# Patient Record
Sex: Female | Born: 1965 | Race: Black or African American | Hispanic: No | Smoking: Never smoker
Health system: Southern US, Community
[De-identification: ages and names within clinical notes are randomized; demographics above are authoritative.]

## PROBLEM LIST (undated history)

## (undated) DIAGNOSIS — J45909 Unspecified asthma, uncomplicated: Secondary | ICD-10-CM

## (undated) HISTORY — PX: FOOT SURGERY: SHX648

---

## 2002-05-10 ENCOUNTER — Emergency Department (HOSPITAL_COMMUNITY): Admission: EM | Admit: 2002-05-10 | Discharge: 2002-05-11 | Payer: Self-pay | Admitting: Emergency Medicine

## 2004-08-29 ENCOUNTER — Emergency Department: Payer: Self-pay | Admitting: Unknown Physician Specialty

## 2004-09-13 ENCOUNTER — Emergency Department: Payer: Self-pay

## 2004-09-30 ENCOUNTER — Emergency Department: Payer: Self-pay | Admitting: Emergency Medicine

## 2011-03-31 ENCOUNTER — Encounter: Payer: Self-pay | Admitting: Family Medicine

## 2011-03-31 ENCOUNTER — Emergency Department (INDEPENDENT_AMBULATORY_CARE_PROVIDER_SITE_OTHER): Payer: Self-pay

## 2011-03-31 ENCOUNTER — Emergency Department (HOSPITAL_BASED_OUTPATIENT_CLINIC_OR_DEPARTMENT_OTHER)
Admission: EM | Admit: 2011-03-31 | Discharge: 2011-03-31 | Disposition: A | Discharge status: Home / Self Care | Payer: Self-pay | Attending: Emergency Medicine | Admitting: Emergency Medicine

## 2011-03-31 DIAGNOSIS — M25519 Pain in unspecified shoulder: Secondary | ICD-10-CM | POA: Insufficient documentation

## 2011-03-31 MED ORDER — HYDROCODONE-ACETAMINOPHEN 5-500 MG PO TABS: 1.0000 | ORAL_TABLET | Freq: Four times a day (QID) | ORAL | Status: AC | PRN

## 2011-03-31 MED ORDER — CYCLOBENZAPRINE HCL 10 MG PO TABS: 5.0000 mg | ORAL_TABLET | Freq: Two times a day (BID) | ORAL | Status: AC | PRN

## 2011-03-31 NOTE — ED Notes (Signed)
Pt c/o right upper arm pain since March. Pt sts pain has increased. Pt does not recall injuring arm.

## 2011-03-31 NOTE — ED Provider Notes (Signed)
History     CSN: [id number] Arrival date & time: 03/31/2011 11:45 AM  Chief Complaint  Patient presents with  . Arm Pain    (Consider location/radiation/quality/duration/timing/severity/associated sxs/prior treatment) HPI Comments: Pt states that she has been having the pain for several months with the pain being the worst in the last couple of months:pt denies any injury  Patient is a 45 y.o. female presenting with arm pain. The history is provided by the patient. No language interpreter was used.  Arm Pain This is a new problem. The current episode started today. The problem occurs constantly. The problem has been unchanged. Pertinent negatives include no numbness or weakness. The symptoms are aggravated by nothing. She has tried NSAIDs for the symptoms. The treatment provided no relief.  Arm Pain This is a new problem. The current episode started today. The problem occurs constantly. The problem has been unchanged. The symptoms are aggravated by nothing. She has tried NSAIDs for the symptoms. The treatment provided no relief.    History reviewed. No pertinent past medical history.  Past Surgical History  Procedure Date  . Foot surgery   . Cesarean section     No family history on file.  History  Substance Use Topics  . Smoking status: Never Smoker   . Smokeless tobacco: Not on file  . Alcohol Use: Yes    OB History    Grav Para Term Preterm Abortions TAB SAB Ect Mult Living                  Review of Systems  Constitutional: Negative.   HENT: Negative.   Respiratory: Negative.   Cardiovascular: Negative.   Neurological: Negative for weakness and numbness.    Allergies  Macrobid  Home Medications  No current outpatient prescriptions on file.  BP 113/85  Pulse 89  Temp(Src) 98.2 F (36.8 C) (Oral)  Resp 16  Ht 5\' 2"  (1.575 m)  Wt 140 lb (63.504 kg)  BMI 25.61 kg/m2  SpO2 100%  LMP 03/30/2011  Physical Exam  Nursing note and vitals  reviewed. Constitutional: She is oriented to person, place, and time. She appears well-developed and well-nourished.  HENT:  Head: Normocephalic and atraumatic.  Neck: Normal range of motion. Neck supple.  Cardiovascular: Normal rate and regular rhythm.   Pulmonary/Chest: Effort normal and breath sounds normal.  Musculoskeletal: Normal range of motion. She exhibits no edema.       Pt tender the in anterior aspect of the right shoulder:pt has full rom:pt has no cervical spine tenderness  Neurological: She is alert and oriented to person, place, and time.  Skin: Skin is warm and dry.  Psychiatric: She has a normal mood and affect.    ED Course  Procedures (including critical care time)  Labs Reviewed - No data to display Dg Shoulder Right  03/31/2011  *RADIOLOGY REPORT*  Clinical Data: Right shoulder pain.  RIGHT SHOULDER - 2+ VIEW  Comparison: None  Findings: No acute bony abnormality.  Specifically, no fracture, subluxation, or dislocation.  Soft tissues are intact.  IMPRESSION: No acute bony abnormality.  Original Report Authenticated By: Cyndie Chime, M.D.     1. Shoulder pain       MDM  No acute finding noted on x-ray:pt not having any neuro deficits:discussed follow up with pt    Medical screening examination/treatment/procedure(s) were performed by non-physician practitioner and as supervising physician I was immediately available for consultation/collaboration.    Teressa Lower, NP 03/31/11 1313  Trudi Ida  Denton Lank, MD 04/10/11 1620

## 2011-04-09 ENCOUNTER — Ambulatory Visit (INDEPENDENT_AMBULATORY_CARE_PROVIDER_SITE_OTHER): Payer: Self-pay | Admitting: Family Medicine

## 2011-04-09 ENCOUNTER — Encounter: Payer: Self-pay | Admitting: Family Medicine

## 2011-04-09 VITALS — BP 108/75 | HR 85 | Temp 98.0°F | Ht 62.0 in | Wt 140.0 lb

## 2011-04-09 DIAGNOSIS — M25519 Pain in unspecified shoulder: Secondary | ICD-10-CM

## 2011-04-09 DIAGNOSIS — M25511 Pain in right shoulder: Secondary | ICD-10-CM | POA: Insufficient documentation

## 2011-04-09 NOTE — Patient Instructions (Signed)
You have rotator cuff impingement (incorporates tendinitis, subacromial bursitis - all treated the same). Try to avoid painful activities (overhead activities, lifting with extended arm) as much as possible. Take aleve 2 tabs twice a day with food for pain and inflammation x 7 days then as needed. Subacromial injection may be beneficial to help with pain and to decrease inflammation. Start physical therapy for 1-3 visits then transition to a home program. Follow up with me in 1 month. If not improving as expected will consider further imaging (ultrasound, MRI).

## 2011-04-09 NOTE — Assessment & Plan Note (Signed)
Right shoulder rotator cuff impingement - offered injection vs nsaids - patient would like to go ahead with injection today.  Start PT for 1-3 visits to learn HEP then do daily for next 6 weeks regularly.  Icing, relative rest.  Minimize lifting, overhead motions as much as possible.  See instructions for further.  Consider ultrasound if not improving as expected.  After informed written consent, patient was seated on exam table. Right shoulder was prepped with alcohol swab and utilizing posterior approach, patient's right subacromial space was injected with 3:1 marcaine: depomedrol. Patient tolerated the procedure well without immediate complications.

## 2011-04-09 NOTE — Progress Notes (Signed)
Subjective:    Patient ID: Phyllis Collins, female    DOB: 1965-12-30, 45 y.o.   MRN: [id number]  PCP: Sharen Hint  HPI 45 yo F here with right shoulder/arm pain.  Patient reports 7-8 months of slowly worsening right shoulder/arm pain. No known injury to account for this. Nothing in particular seems to make this worse. Is right handed. No numbness or tingling. No neck pain. No bowel/bladder dysfunction. No left arm pain. Has tried aleve, heat, biofreeze, flexeril/vicodin (from ED). X-rays of shoulder were negative. + night pain.  History reviewed. No pertinent past medical history.  Current Outpatient Prescriptions on File Prior to Visit  Medication Sig Dispense Refill  . cyclobenzaprine (FLEXERIL) 10 MG tablet Take 0.5 tablets (5 mg total) by mouth 2 (two) times daily as needed for muscle spasms.  10 tablet  0  . HYDROcodone-acetaminophen (VICODIN) 5-500 MG per tablet Take 1-2 tablets by mouth every 6 (six) hours as needed for pain.  15 tablet  0    Past Surgical History  Procedure Date  . Foot surgery   . Cesarean section     Allergies  Allergen Reactions  . Macrobid     History   Social History  . Marital Status: Divorced    Spouse Name: N/A    Number of Children: N/A  . Years of Education: N/A   Occupational History  . Not on file.   Social History Main Topics  . Smoking status: Never Smoker   . Smokeless tobacco: Not on file  . Alcohol Use: Yes  . Drug Use: No  . Sexually Active: Yes    Birth Control/ Protection: Condom   Other Topics Concern  . Not on file   Social History Narrative  . No narrative on file    Family History  Problem Relation Age of Onset  . Sudden death Father   . Heart attack Father   . Hypertension Paternal Grandmother   . Diabetes Neg Hx   . Hyperlipidemia Neg Hx     BP 108/75  Pulse 85  Temp(Src) 98 F (36.7 C) (Oral)  Ht 5\' 2"  (1.575 m)  Wt 140 lb (63.504 kg)  BMI 25.61 kg/m2  LMP 03/30/2011  Review of  Systems See HPI above.    Objective:   Physical Exam Gen: NAD  Neck: No gross deformity, swelling, bruising. No paraspinal TTP .  No midline/bony TTP. FROM neck without pain . BUE strength 5/5 except empty can.   Sensation intact to light touch.   2+ equal reflexes in triceps, biceps, brachioradialis tendons. Negative spurlings. NV intact distal BUEs.  R shoulder: No swelling, ecchymoses.  No gross deformity. No TTP. FROM with + painful arc. Positive Hawkins, Neers. Negative Speeds, Yergasons. Positive Empty can with 4/5 strength on right.  5/5 strength resisted internal/external rotation without pain. Negative apprehension. NV intact distally.    Assessment & Plan:  1. Right shoulder rotator cuff impingement - offered injection vs nsaids - patient would like to go ahead with injection today.  Start PT for 1-3 visits to learn HEP then do daily for next 6 weeks regularly.  Icing, relative rest.  Minimize lifting, overhead motions as much as possible.  See instructions for further.  Consider ultrasound if not improving as expected.  After informed written consent, patient was seated on exam table. Right shoulder was prepped with alcohol swab and utilizing posterior approach, patient's right subacromial space was injected with 3:1 marcaine: depomedrol. Patient tolerated the procedure well without immediate  complications.

## 2011-04-12 ENCOUNTER — Ambulatory Visit: Payer: Self-pay | Attending: Family Medicine | Admitting: Physical Therapy

## 2011-05-10 ENCOUNTER — Ambulatory Visit: Payer: Self-pay | Admitting: Family Medicine

## 2011-06-16 ENCOUNTER — Ambulatory Visit (INDEPENDENT_AMBULATORY_CARE_PROVIDER_SITE_OTHER): Payer: Self-pay | Admitting: Family Medicine

## 2011-06-16 ENCOUNTER — Encounter: Payer: Self-pay | Admitting: Family Medicine

## 2011-06-16 VITALS — BP 114/78 | HR 91 | Temp 97.5°F | Ht 62.0 in | Wt 136.0 lb

## 2011-06-16 DIAGNOSIS — M25511 Pain in right shoulder: Secondary | ICD-10-CM

## 2011-06-16 DIAGNOSIS — M25519 Pain in unspecified shoulder: Secondary | ICD-10-CM

## 2011-06-16 MED ORDER — MELOXICAM 15 MG PO TABS: 15.0000 mg | ORAL_TABLET | Freq: Every day | ORAL | Status: AC

## 2011-06-16 MED ORDER — TRAMADOL HCL 50 MG PO TABS: 50.0000 mg | ORAL_TABLET | Freq: Four times a day (QID) | ORAL | Status: AC | PRN

## 2011-06-16 NOTE — Assessment & Plan Note (Signed)
Right shoulder rotator cuff impingement - u/s done today reaffirms diagnosis.  Stressed need for patient to go to physical therapy for strengthening.  Would not repeat injection at this time though can consider in future.  Start meloxicam daily with food, tramadol as needed for pain (vicodin only caused itching - will try this).  Icing, relative rest as we discussed.

## 2011-06-16 NOTE — Patient Instructions (Signed)
You have rotator cuff impingement (incorporates tendinitis, subacromial bursitis - all treated the same). Your ultrasound looked great though it confirmed impingement. Try to avoid painful activities (overhead activities, lifting with extended arm) as much as possible. Take aleve 2 tabs twice a day with food for pain and inflammation as needed. Subacromial injection may be beneficial to help with pain and to decrease inflammation - can repeat this once more but would not do it more than that. Start physical therapy for 1-3 visits then transition to a home program - this is very important. Follow up with me in 1 month to 6 weeks for a recheck.

## 2011-06-16 NOTE — Progress Notes (Addendum)
Subjective:    Patient ID: Phyllis Collins, female    DOB: [birth year], 45 y.o.   MRN: [id number]  PCP: Sharen Hint  HPI  45 yo F here with f/u right shoulder/arm pain.  10/19: Patient reports 7-8 months of slowly worsening right shoulder/arm pain. No known injury to account for this. Nothing in particular seems to make this worse. Is right handed. No numbness or tingling. No neck pain. No bowel/bladder dysfunction. No left arm pain. Has tried aleve, heat, biofreeze, flexeril/vicodin (from ED). X-rays of shoulder were negative. + night pain.  12/26: Patient returns of right shoulder pain. States subacromial injection helped for a month but pain recurred. Unable to do PT due to her work schedule - has not gone yet. Vicodin caused too much itching so she stopped this. + night pain.  History reviewed. No pertinent past medical history.  No current outpatient prescriptions on file prior to visit.    Past Surgical History  Procedure Date  . Foot surgery   . Cesarean section     Allergies  Allergen Reactions  . Macrobid   . Vicodin (Hydrocodone-Acetaminophen)     itching    History   Social History  . Marital Status: Divorced    Spouse Name: N/A    Number of Children: N/A  . Years of Education: N/A   Occupational History  . Not on file.   Social History Main Topics  . Smoking status: Never Smoker   . Smokeless tobacco: Not on file  . Alcohol Use: Yes  . Drug Use: No  . Sexually Active: Yes    Birth Control/ Protection: Condom   Other Topics Concern  . Not on file   Social History Narrative  . No narrative on file    Family History  Problem Relation Age of Onset  . Sudden death Father   . Heart attack Father   . Hypertension Paternal Grandmother   . Diabetes Neg Hx   . Hyperlipidemia Neg Hx     BP 114/78  Pulse 91  Temp(Src) 97.5 F (36.4 C) (Oral)  Ht 5\' 2"  (1.575 m)  Wt 136 lb (61.689 kg)  BMI 24.87 kg/m2  Review of Systems  See  HPI above.    Objective:   Physical Exam  Gen: NAD  R shoulder: No swelling, ecchymoses.  No gross deformity. No TTP. FROM with + painful arc. Positive Hawkins, Neers. Negative Speeds, Yergasons. Positive Empty can with 4+/5 strength on right.  5/5 strength resisted internal/external rotation without pain. Negative apprehension. NV intact distally.  MSK u/s R shoulder: Biceps tendon intact on trans and long views.  AC joint appears normal.  Subscapularis intact on trans and long views without tears or impingement.  Infraspinatus appears intact on long view.  Supraspinatus with evidence impingement on dynamic motion under acromion.  No evidence of supraspinatus tear, mildly increased fluid in subacromial bursa.    Assessment & Plan:  1. Right shoulder rotator cuff impingement - u/s done today reaffirms diagnosis.  Stressed need for patient to go to physical therapy for strengthening.  Would not repeat injection at this time though can consider in future.  Start meloxicam daily with food, tramadol as needed for pain (vicodin only caused itching - will try this).  Icing, relative rest as we discussed.    Addendum 1/9: Patient called stating right shoulder pain has worsened more.  She is unable to do physical therapy as she does not have insurance.  Has been given contact information  to get setup with [facility] Coverage so we can start PT.  Does a lot of manual labor at work - will write for her to be out this week and see her tomorrow or Friday to reexamine, do cortisone injection, and write for light duty.  Would prefer to do PT first and is a little early to repeat injection but options limited without insurance - would not do a 3rd injection.

## 2011-06-30 ENCOUNTER — Encounter: Payer: Self-pay | Admitting: Family Medicine

## 2011-07-01 ENCOUNTER — Encounter: Payer: Self-pay | Admitting: Family Medicine

## 2011-07-01 ENCOUNTER — Ambulatory Visit (INDEPENDENT_AMBULATORY_CARE_PROVIDER_SITE_OTHER): Payer: Self-pay | Admitting: Family Medicine

## 2011-07-01 VITALS — BP 120/76 | HR 87 | Temp 98.0°F | Ht 62.0 in | Wt 135.0 lb

## 2011-07-01 DIAGNOSIS — M25519 Pain in unspecified shoulder: Secondary | ICD-10-CM

## 2011-07-01 DIAGNOSIS — M25511 Pain in right shoulder: Secondary | ICD-10-CM

## 2011-07-02 ENCOUNTER — Encounter: Payer: Self-pay | Admitting: Family Medicine

## 2011-07-02 NOTE — Assessment & Plan Note (Signed)
confirmed by ultrasound last visit.  Applying for [facility] coverage.  Given repeat subacromial injection today (would not do this a 3rd time) and shown home exercises.  Otherwise continue icing, tylenol/nsaids as needed.  Tramadol as needed for pain.  Start light duty - no lifting more than 5 pounds and no overhead activities with right arm to allow this to rest.  Will reassess at f/u in 1 month, call when [facility] coverage goes through to start formal PT.  After informed written consent, patient was seated on exam table. Right shoulder was prepped with alcohol swab and utilizing posterior approach, patient's right subacromial space was injected with 3:1 marcaine: depomedrol. Patient tolerated the procedure well without immediate complications.

## 2011-07-02 NOTE — Progress Notes (Signed)
Subjective:    Patient ID: Phyllis Collins, female    DOB: [birth year], 46 y.o.   MRN: [id number]  PCP: Sharen Hint  Shoulder Pain    46 yo F here with f/u right shoulder/arm pain.  10/19: Patient reports 7-8 months of slowly worsening right shoulder/arm pain. No known injury to account for this. Nothing in particular seems to make this worse. Is right handed. No numbness or tingling. No neck pain. No bowel/bladder dysfunction. No left arm pain. Has tried aleve, heat, biofreeze, flexeril/vicodin (from ED). X-rays of shoulder were negative. + night pain.  12/26: Patient returns of right shoulder pain. States subacromial injection helped for a month but pain recurred. Unable to do PT due to her work schedule - has not gone yet. Vicodin caused too much itching so she stopped this. + night pain.  07/01/11: Patient returned today for repeat subacromial injection. Goal was to start PT but patient does not have any insurance or [employer] coverage - going to start this process today though and call to start PT. + night pain. Otherwise unchanged from last visit.  History reviewed. No pertinent past medical history.  Current Outpatient Prescriptions on File Prior to Visit  Medication Sig Dispense Refill  . meloxicam (MOBIC) 15 MG tablet Take 1 tablet (15 mg total) by mouth daily. With food.  30 tablet  1    Past Surgical History  Procedure Date  . Foot surgery   . Cesarean section     Allergies  Allergen Reactions  . Macrobid   . Vicodin (Hydrocodone-Acetaminophen)     itching    History   Social History  . Marital Status: Divorced    Spouse Name: N/A    Number of Children: N/A  . Years of Education: N/A   Occupational History  . Not on file.   Social History Main Topics  . Smoking status: Never Smoker   . Smokeless tobacco: Not on file  . Alcohol Use: Yes  . Drug Use: No  . Sexually Active: Yes    Birth Control/ Protection: Condom   Other Topics Concern  .  Not on file   Social History Narrative  . No narrative on file    Family History  Problem Relation Age of Onset  . Sudden death Father   . Heart attack Father   . Hypertension Paternal Grandmother   . Diabetes Neg Hx   . Hyperlipidemia Neg Hx     BP 120/76  Pulse 87  Temp(Src) 98 F (36.7 C) (Oral)  Ht 5\' 2"  (1.575 m)  Wt 135 lb (61.236 kg)  BMI 24.69 kg/m2  Review of Systems  See HPI above.    Objective:   Physical Exam  Gen: NAD *Exam not repeated today  R shoulder: No swelling, ecchymoses.  No gross deformity. No TTP. FROM with + painful arc. Positive Hawkins, Neers. Negative Speeds, Yergasons. Positive Empty can with 4+/5 strength on right.  5/5 strength resisted internal/external rotation without pain. Negative apprehension. NV intact distally.     Assessment & Plan:  1. Right shoulder rotator cuff impingement - confirmed by ultrasound last visit.  Applying for [employer] coverage.  Given repeat subacromial injection today (would not do this a 3rd time) and shown home exercises.  Otherwise continue icing, tylenol/nsaids as needed.  Tramadol as needed for pain.  Start light duty - no lifting more than 5 pounds and no overhead activities with right arm to allow this to rest.  Will reassess at f/u in  1 month, call when [employer] coverage goes through to start formal PT.  After informed written consent, patient was seated on exam table. Right shoulder was prepped with alcohol swab and utilizing posterior approach, patient's right subacromial space was injected with 3:1 marcaine: depomedrol. Patient tolerated the procedure well without immediate complications.

## 2011-07-29 ENCOUNTER — Ambulatory Visit: Payer: Self-pay | Admitting: Family Medicine

## 2011-11-04 ENCOUNTER — Other Ambulatory Visit: Payer: Self-pay | Admitting: *Deleted

## 2011-11-04 ENCOUNTER — Other Ambulatory Visit: Payer: Self-pay | Admitting: Family Medicine

## 2011-11-04 DIAGNOSIS — Z1231 Encounter for screening mammogram for malignant neoplasm of breast: Secondary | ICD-10-CM

## 2011-11-04 DIAGNOSIS — D219 Benign neoplasm of connective and other soft tissue, unspecified: Secondary | ICD-10-CM

## 2011-11-11 ENCOUNTER — Other Ambulatory Visit: Payer: Self-pay

## 2011-11-18 ENCOUNTER — Ambulatory Visit
Admission: RE | Admit: 2011-11-18 | Discharge: 2011-11-18 | Disposition: A | Discharge status: Home / Self Care | Payer: BC Managed Care – PPO | Source: Ambulatory Visit | Attending: Family Medicine | Admitting: Family Medicine

## 2011-11-18 DIAGNOSIS — D219 Benign neoplasm of connective and other soft tissue, unspecified: Secondary | ICD-10-CM

## 2011-12-02 ENCOUNTER — Ambulatory Visit: Payer: Self-pay

## 2012-03-18 ENCOUNTER — Emergency Department (HOSPITAL_BASED_OUTPATIENT_CLINIC_OR_DEPARTMENT_OTHER)
Admission: EM | Admit: 2012-03-18 | Discharge: 2012-03-18 | Disposition: A | Discharge status: Home / Self Care | Payer: Self-pay | Attending: Emergency Medicine | Admitting: Emergency Medicine

## 2012-03-18 ENCOUNTER — Encounter (HOSPITAL_BASED_OUTPATIENT_CLINIC_OR_DEPARTMENT_OTHER): Payer: Self-pay | Admitting: *Deleted

## 2012-03-18 DIAGNOSIS — N390 Urinary tract infection, site not specified: Secondary | ICD-10-CM | POA: Insufficient documentation

## 2012-03-18 DIAGNOSIS — J329 Chronic sinusitis, unspecified: Secondary | ICD-10-CM | POA: Insufficient documentation

## 2012-03-18 LAB — URINALYSIS, ROUTINE W REFLEX MICROSCOPIC
Bilirubin Urine: NEGATIVE
Nitrite: POSITIVE — AB
Specific Gravity, Urine: 1.019 (ref 1.005–1.030)
pH: 6 (ref 5.0–8.0)

## 2012-03-18 LAB — PREGNANCY, URINE: Preg Test, Ur: NEGATIVE

## 2012-03-18 LAB — URINE MICROSCOPIC-ADD ON

## 2012-03-18 MED ORDER — IBUPROFEN 600 MG PO TABS: 600.0000 mg | ORAL_TABLET | Freq: Four times a day (QID) | ORAL | Status: DC | PRN

## 2012-03-18 MED ORDER — OXYMETAZOLINE HCL 0.05 % NA SOLN: 2.0000 | Freq: Two times a day (BID) | NASAL | Status: DC

## 2012-03-18 MED ORDER — CEPHALEXIN 500 MG PO CAPS: 500.0000 mg | ORAL_CAPSULE | Freq: Four times a day (QID) | ORAL | Status: DC

## 2012-03-18 NOTE — ED Notes (Addendum)
Pt c/o sinus issues with drainage into throat onset Thursday. Also c/o LLQ pain. "Think I have a UTI"

## 2012-03-18 NOTE — ED Provider Notes (Signed)
History     CSN: [id number]  Arrival date & time 03/18/12  1617   First MD Initiated Contact with Patient 03/18/12 1709      Chief Complaint  Patient presents with  . Facial Pain    (Consider location/radiation/quality/duration/timing/severity/associated sxs/prior treatment) HPI Comments: Patient presents with complaint of nasal congestion, sinus pain, and intermittent headache for 2 days. No treatments prior to arrival. Patient denies seasonal allergies. She denies fever, sore throat, cough, chest pain. Patient also complains of left lower quadrant abdominal pain that she attributes to a possible urinary tract infection. She has had the same symptoms in the past with UTI. She denies dysuria, hematuria, vaginal discharge. Onset gradual. Course is constant. Nothing makes symptoms better or worse.  The history is provided by the patient.    History reviewed. No pertinent past medical history.  Past Surgical History  Procedure Date  . Foot surgery   . Cesarean section     Family History  Problem Relation Age of Onset  . Sudden death Father   . Heart attack Father   . Hypertension Paternal Grandmother   . Diabetes Neg Hx   . Hyperlipidemia Neg Hx     History  Substance Use Topics  . Smoking status: Never Smoker   . Smokeless tobacco: Not on file  . Alcohol Use: Yes    OB History    Grav Para Term Preterm Abortions TAB SAB Ect Mult Living                  Review of Systems  Constitutional: Negative for fever.  HENT: Positive for congestion, rhinorrhea and sinus pressure. Negative for ear pain, sore throat and tinnitus.   Eyes: Negative for redness.  Respiratory: Negative for cough.   Cardiovascular: Negative for chest pain.  Gastrointestinal: Positive for abdominal pain. Negative for nausea, vomiting and diarrhea.  Genitourinary: Negative for dysuria, hematuria, flank pain, vaginal bleeding and vaginal discharge.  Musculoskeletal: Negative for myalgias.  Skin:  Negative for rash.  Neurological: Positive for headaches.    Allergies  Nitrofurantoin monohyd macro and Vicodin  Home Medications   Current Outpatient Rx  Name Route Sig Dispense Refill  . MELOXICAM 15 MG PO TABS Oral Take 1 tablet (15 mg total) by mouth daily. With food. 30 tablet 1    BP 104/70  Pulse 94  Temp 98.2 F (36.8 C) (Oral)  Resp 14  Ht 5\' 2"  (1.575 m)  Wt 140 lb (63.504 kg)  BMI 25.61 kg/m2  SpO2 100%  Physical Exam  Nursing note and vitals reviewed. Constitutional: She appears well-developed and well-nourished.  HENT:  Head: Normocephalic and atraumatic.  Right Ear: Tympanic membrane, external ear and ear canal normal.  Left Ear: Tympanic membrane, external ear and ear canal normal.  Nose: Mucosal edema and rhinorrhea present. Right sinus exhibits frontal sinus tenderness. Right sinus exhibits no maxillary sinus tenderness. Left sinus exhibits frontal sinus tenderness. Left sinus exhibits no maxillary sinus tenderness.  Mouth/Throat: Uvula is midline and oropharynx is clear and moist. Mucous membranes are not dry. No oropharyngeal exudate, posterior oropharyngeal edema, posterior oropharyngeal erythema or tonsillar abscesses.  Eyes: Conjunctivae normal are normal. Right eye exhibits no discharge. Left eye exhibits no discharge.  Neck: Normal range of motion. Neck supple.  Cardiovascular: Normal rate, regular rhythm and normal heart sounds.   Pulmonary/Chest: Effort normal and breath sounds normal.  Abdominal: Soft. There is no tenderness.  Neurological: She is alert.  Skin: Skin is warm and dry.  Psychiatric: She has a normal mood and affect.    ED Course  Procedures (including critical care time)  Labs Reviewed  URINALYSIS, ROUTINE W REFLEX MICROSCOPIC - Abnormal; Notable for the following:    APPearance CLOUDY (*)     Hgb urine dipstick SMALL (*)     Nitrite POSITIVE (*)     Leukocytes, UA SMALL (*)     All other components within normal limits    URINE MICROSCOPIC-ADD ON - Abnormal; Notable for the following:    Squamous Epithelial / LPF FEW (*)     Bacteria, UA MANY (*)     All other components within normal limits  PREGNANCY, URINE   No results found.   1. Sinusitis   2. UTI (lower urinary tract infection)     5:43 PM Patient seen and examined.   Vital signs reviewed and are as follows: Filed Vitals:   03/18/12 1626  BP: 104/70  Pulse: 94  Temp: 98.2 F (36.8 C)  Resp: 14   Patient counseled on supportive treatment of sinusitis.  Patient informed of urinalysis results. Will treat with Keflex.  Patient urged to return with worsening symptoms, persistent fever, persistent vomiting, or other concerns. Patient verbalizes understanding and agrees with plan.   MDM  Abdominal pain: UA shows nitrate positive urinary tract infection, do not suspect pyelo. Will treat with 10 days of Keflex given recurrent UTI.  Sinusitis: Two-day history, do not suspect bacterial sinusitis. Conservative management indicated.        [place], [place] 03/18/12 [phone]

## 2012-03-18 NOTE — ED Provider Notes (Signed)
Medical screening examination/treatment/procedure(s) were performed by non-physician practitioner and as supervising physician I was immediately available for consultation/collaboration.   Gwyneth Sprout, MD 03/18/12 [phone]

## 2013-04-18 ENCOUNTER — Other Ambulatory Visit: Payer: Self-pay

## 2013-04-18 DIAGNOSIS — Z1231 Encounter for screening mammogram for malignant neoplasm of breast: Secondary | ICD-10-CM

## 2013-05-10 ENCOUNTER — Ambulatory Visit: Payer: BC Managed Care – PPO

## 2013-08-24 ENCOUNTER — Emergency Department (HOSPITAL_BASED_OUTPATIENT_CLINIC_OR_DEPARTMENT_OTHER)
Admission: EM | Admit: 2013-08-24 | Discharge: 2013-08-24 | Disposition: A | Discharge status: Home / Self Care | Payer: BC Managed Care – PPO | Attending: Emergency Medicine | Admitting: Emergency Medicine

## 2013-08-24 ENCOUNTER — Encounter (HOSPITAL_BASED_OUTPATIENT_CLINIC_OR_DEPARTMENT_OTHER): Payer: Self-pay | Admitting: Emergency Medicine

## 2013-08-24 DIAGNOSIS — Z792 Long term (current) use of antibiotics: Secondary | ICD-10-CM | POA: Insufficient documentation

## 2013-08-24 DIAGNOSIS — L258 Unspecified contact dermatitis due to other agents: Secondary | ICD-10-CM | POA: Insufficient documentation

## 2013-08-24 DIAGNOSIS — L259 Unspecified contact dermatitis, unspecified cause: Secondary | ICD-10-CM

## 2013-08-24 MED ORDER — HYDROCORTISONE 2.5 % EX LOTN: TOPICAL_LOTION | Freq: Two times a day (BID) | CUTANEOUS | Status: DC

## 2013-08-24 MED ORDER — HYDROXYZINE HCL 25 MG PO TABS: 25.0000 mg | ORAL_TABLET | Freq: Four times a day (QID) | ORAL | Status: DC | PRN

## 2013-08-24 MED ORDER — HYDROXYZINE HCL 25 MG PO TABS
25.0000 mg | ORAL_TABLET | Freq: Once | ORAL | Status: AC
  Administered 2013-08-24: 25 mg via ORAL
  Filled 2013-08-24: qty 1

## 2013-08-24 NOTE — Discharge Instructions (Signed)
Contact Dermatitis Contact dermatitis is a reaction to certain substances that touch the skin. Contact dermatitis can be either irritant contact dermatitis or allergic contact dermatitis. Irritant contact dermatitis does not require previous exposure to the substance for a reaction to occur.Allergic contact dermatitis only occurs if you have been exposed to the substance before. Upon a repeat exposure, your body reacts to the substance.  CAUSES  Many substances can cause contact dermatitis. Irritant dermatitis is most commonly caused by repeated exposure to mildly irritating substances, such as:  Makeup.  Soaps.  Detergents.  Bleaches.  Acids.  Metal salts, such as nickel. Allergic contact dermatitis is most commonly caused by exposure to:  Poisonous plants.  Chemicals (deodorants, shampoos).  Jewelry.  Latex.  Neomycin in triple antibiotic cream.  Preservatives in products, including clothing. SYMPTOMS  The area of skin that is exposed may develop:  Dryness or flaking.  Redness.  Cracks.  Itching.  Pain or a burning sensation.  Blisters. With allergic contact dermatitis, there may also be swelling in areas such as the eyelids, mouth, or genitals.  DIAGNOSIS  Your caregiver can usually tell what the problem is by doing a physical exam. In cases where the cause is uncertain and an allergic contact dermatitis is suspected, a patch skin test may be performed to help determine the cause of your dermatitis. TREATMENT Treatment includes protecting the skin from further contact with the irritating substance by avoiding that substance if possible. Barrier creams, powders, and gloves may be helpful. Your caregiver may also recommend:  Steroid creams or ointments applied 2 times daily. For best results, soak the rash area in cool water for 20 minutes. Then apply the medicine. Cover the area with a plastic wrap. You can store the steroid cream in the refrigerator for a "chilly"  effect on your rash. That may decrease itching. Oral steroid medicines may be needed in more severe cases.  Antibiotics or antibacterial ointments if a skin infection is present.  Antihistamine lotion or an antihistamine taken by mouth to ease itching.  Lubricants to keep moisture in your skin.  Burow's solution to reduce redness and soreness or to dry a weeping rash. Mix one packet or tablet of solution in 2 cups cool water. Dip a clean washcloth in the mixture, wring it out a bit, and put it on the affected area. Leave the cloth in place for 30 minutes. Do this as often as possible throughout the day.  Taking several cornstarch or baking soda baths daily if the area is too large to cover with a washcloth. Harsh chemicals, such as alkalis or acids, can cause skin damage that is like a burn. You should flush your skin for 15 to 20 minutes with cold water after such an exposure. You should also seek immediate medical care after exposure. Bandages (dressings), antibiotics, and pain medicine may be needed for severely irritated skin.  HOME CARE INSTRUCTIONS  Avoid the substance that caused your reaction.  Keep the area of skin that is affected away from hot water, soap, sunlight, chemicals, acidic substances, or anything else that would irritate your skin.  Do not scratch the rash. Scratching may cause the rash to become infected.  You may take cool baths to help stop the itching.  Only take over-the-counter or prescription medicines as directed by your caregiver.  See your caregiver for follow-up care as directed to make sure your skin is healing properly. SEEK MEDICAL CARE IF:   Your condition is not better after 3  days of treatment.  You seem to be getting worse.  You see signs of infection such as swelling, tenderness, redness, soreness, or warmth in the affected area.  You have any problems related to your medicines. Document Released: 06/04/2000 Document Revised: 08/30/2011  Document Reviewed: 11/10/2010 [facility] Patient Information 2014 [place], [place].   Take prescriptions as indicated Return if worsening or no gradual improvement

## 2013-08-24 NOTE — ED Provider Notes (Signed)
CSN: [id number]     Arrival date & time 08/24/13  1528 History   First MD Initiated Contact with Patient 08/24/13 1556     Chief Complaint  Patient presents with  . Rash     (Consider location/radiation/quality/duration/timing/severity/associated sxs/prior Treatment) Patient is a 48 y.o. female presenting with rash. The history is provided by the patient. No language interpreter was used.  Rash Location:  Torso Quality: itchiness and redness   Severity:  Moderate Duration:  3 days Context comment:  Rash developed after wearing a new, red shirt that had not been laundered Associated symptoms: no abdominal pain, no fatigue, no fever, no headaches, no joint pain, no myalgias, no nausea, no shortness of breath, no sore throat and no URI    Pt presents with a rash on her chest, back and upper arms. She reports itching after wearing a new red shirt that she had not laundered yet. She believes that this is what is causing her discomfort and benadryl has not helped much.   History reviewed. No pertinent past medical history. Past Surgical History  Procedure Laterality Date  . Foot surgery    . Cesarean section     Family History  Problem Relation Age of Onset  . Sudden death Father   . Heart attack Father   . Hypertension Paternal Grandmother   . Diabetes Neg Hx   . Hyperlipidemia Neg Hx    History  Substance Use Topics  . Smoking status: Never Smoker   . Smokeless tobacco: Not on file  . Alcohol Use: Yes   OB History   Grav Para Term Preterm Abortions TAB SAB Ect Mult Living                 Review of Systems  Constitutional: Negative for fever and fatigue.  HENT: Negative for sore throat.   Respiratory: Negative for shortness of breath.   Gastrointestinal: Negative for nausea and abdominal pain.  Musculoskeletal: Negative for arthralgias and myalgias.  Skin: Positive for rash.  Neurological: Negative for headaches.  All other systems reviewed and are  negative.      Allergies  Nitrofurantoin monohyd macro and Vicodin  Home Medications   Current Outpatient Rx  Name  Route  Sig  Dispense  Refill  . cephALEXin (KEFLEX) 500 MG capsule   Oral   Take 1 capsule (500 mg total) by mouth 4 (four) times daily.   40 capsule   0   . ibuprofen (ADVIL,MOTRIN) 600 MG tablet   Oral   Take 1 tablet (600 mg total) by mouth every 6 (six) hours as needed for pain.   20 tablet   0   . oxymetazoline (AFRIN NASAL SPRAY) 0.05 % nasal spray   Nasal   Place 2 sprays into the nose 2 (two) times daily.   30 mL   0    BP 109/66  Pulse 96  Temp(Src) 98.1 F (36.7 C) (Oral)  Resp 18  SpO2 100% Physical Exam  Nursing note and vitals reviewed. Constitutional: She is oriented to person, place, and time. She appears well-developed and well-nourished. No distress.  Well-appearing   HENT:  Head: Normocephalic and atraumatic.  Mouth/Throat: Oropharynx is clear and moist.  Eyes: Conjunctivae and EOM are normal. Pupils are equal, round, and reactive to light.  Neck: Normal range of motion. Neck supple. No JVD present. No tracheal deviation present. No thyromegaly present.  Cardiovascular: Normal rate and regular rhythm.   Pulmonary/Chest: Effort normal and breath sounds normal.  No respiratory distress. She has no wheezes.  Musculoskeletal: Normal range of motion.  Lymphadenopathy:    She has no cervical adenopathy.  Neurological: She is alert and oriented to person, place, and time.  Skin: Rash noted.  Fine, erythematous and pruiritic rash on torso, breast, and upper arms.   Psychiatric: She has a normal mood and affect. Her behavior is normal. Judgment and thought content normal.    ED Course  Procedures (including critical care time) Labs Review Labs Reviewed - No data to display Imaging Review No results found.   EKG Interpretation None      MDM   Final diagnoses:  Contact dermatitis   Fine, erythematous rash with itching.  Contact dermatitis after wearing a new shirt. Feeling some relief here after vistaril. Prescriptions for hydrocortisone and vistaril given. Discussed plan with pt and she agrees. No history of febrile illness. Follow-up with PCP.      Elisha Headland, NP 08/28/13 [phone]

## 2013-08-24 NOTE — ED Notes (Signed)
Pt c/o rash to neck and torso x 3 days.

## 2013-08-29 NOTE — ED Provider Notes (Signed)
Medical screening examination/treatment/procedure(s) were performed by non-physician practitioner and as supervising physician I was immediately available for consultation/collaboration.   EKG Interpretation None        Houston Siren III, MD 08/29/13 [phone]

## 2014-01-15 ENCOUNTER — Encounter (HOSPITAL_BASED_OUTPATIENT_CLINIC_OR_DEPARTMENT_OTHER): Payer: Self-pay | Admitting: Emergency Medicine

## 2014-01-15 ENCOUNTER — Emergency Department (HOSPITAL_BASED_OUTPATIENT_CLINIC_OR_DEPARTMENT_OTHER)
Admission: EM | Admit: 2014-01-15 | Discharge: 2014-01-15 | Disposition: A | Discharge status: Home / Self Care | Payer: BC Managed Care – PPO | Attending: Emergency Medicine | Admitting: Emergency Medicine

## 2014-01-15 DIAGNOSIS — J3489 Other specified disorders of nose and nasal sinuses: Secondary | ICD-10-CM | POA: Insufficient documentation

## 2014-01-15 DIAGNOSIS — J069 Acute upper respiratory infection, unspecified: Secondary | ICD-10-CM

## 2014-01-15 DIAGNOSIS — R0981 Nasal congestion: Secondary | ICD-10-CM

## 2014-01-15 DIAGNOSIS — Z79899 Other long term (current) drug therapy: Secondary | ICD-10-CM | POA: Insufficient documentation

## 2014-01-15 MED ORDER — OXYMETAZOLINE HCL 0.05 % NA SOLN
1.0000 | Freq: Once | NASAL | Status: AC
  Administered 2014-01-15: 1 via NASAL
  Filled 2014-01-15: qty 15

## 2014-01-15 NOTE — ED Notes (Signed)
Pt reports sinus pressure. Pain to right side of face. Also sts sore throat with some pain when swallowing.

## 2014-01-15 NOTE — Discharge Instructions (Signed)
Return to the ED with any concerns including fever, difficulty breathing or swallowing, vomiting and not able to keep down liquids, decreased level of alertness/lethargy, or any other alarming symptoms

## 2014-01-15 NOTE — ED Provider Notes (Signed)
CSN: [id number]     Arrival date & time 01/15/14  1003 History   First MD Initiated Contact with Patient 01/15/14 1022     Chief Complaint  Patient presents with  . URI     (Consider location/radiation/quality/duration/timing/severity/associated sxs/prior Treatment) HPI Pt presenting with c/o right sided throat pain associated with nasal congestion.  Pain radiates up to right ear.  No fever/chills.  Some pain with swallowing, feels she has a bad taste in her throat with swallowing.  Pt states she has tried multiple OTC decongestants as well as steroid nasal spray.  No vomiting, no difficulty breathing,  She has continued to drink liquids well.  There are no other associated systemic symptoms, there are no other alleviating or modifying factors.   History reviewed. No pertinent past medical history. Past Surgical History  Procedure Laterality Date  . Foot surgery    . Cesarean section     Family History  Problem Relation Age of Onset  . Sudden death Father   . Heart attack Father   . Hypertension Paternal Grandmother   . Diabetes Neg Hx   . Hyperlipidemia Neg Hx    History  Substance Use Topics  . Smoking status: Never Smoker   . Smokeless tobacco: Not on file  . Alcohol Use: Yes     Comment: occ   OB History   Grav Para Term Preterm Abortions TAB SAB Ect Mult Living                 Review of Systems ROS reviewed and all otherwise negative except for mentioned in HPI    Allergies  Nitrofurantoin monohyd macro  Home Medications   Prior to Admission medications   Medication Sig Start Date End Date Taking? Authorizing Provider  cephALEXin (KEFLEX) 500 MG capsule Take 1 capsule (500 mg total) by mouth 4 (four) times daily. 03/18/12   Carlisle Cater, PA-C  hydrocortisone 2.5 % lotion Apply topically 2 (two) times daily. 08/24/13   Elisha Headland, NP  hydrOXYzine (ATARAX/VISTARIL) 25 MG tablet Take 1 tablet (25 mg total) by mouth every 6 (six) hours as needed for itching.  08/24/13   Elisha Headland, NP  ibuprofen (ADVIL,MOTRIN) 600 MG tablet Take 1 tablet (600 mg total) by mouth every 6 (six) hours as needed for pain. 03/18/12   Carlisle Cater, PA-C  oxymetazoline (AFRIN NASAL SPRAY) 0.05 % nasal spray Place 2 sprays into the nose 2 (two) times daily. 03/18/12   Carlisle Cater, PA-C   BP 120/75  Pulse 92  Temp(Src) 98.9 F (37.2 C) (Oral)  Resp 16  Ht 5\' 2"  (1.575 m)  Wt 145 lb (65.772 kg)  BMI 26.51 kg/m2  SpO2 100% Vitals reviewed Physical Exam Physical Examination: General appearance - alert, well appearing, and in no distress Mental status - alert, oriented to person, place, and time Eyes - no conjunctival injection, no scleral icterus Ears - bilateral TM's and external ear canals normal Face- no ttp over maxillary or frontal sinuses, no ttp of maxillary teeth Mouth - mucous membranes moist, pharynx normal without lesions Neck - supple, no significant adenopathy Chest - clear to auscultation, no wheezes, rales or rhonchi, symmetric air entry Heart - normal rate, regular rhythm, normal S1, S2, no murmurs, rubs, clicks or gallops Extremities - peripheral pulses normal, no pedal edema, no clubbing or cyanosis Skin - normal coloration and turgor, no rashes  ED Course  Procedures (including critical care time) Labs Review Labs Reviewed - No data to display  Imaging Review No results found.   EKG Interpretation None      MDM   Final diagnoses:  URI (upper respiratory infection)  Nasal sinus congestion    Pt presnting with sore throat and right sided ear pain, also reports nasal congestion over the past several days.  No fever/chills.  She has tried multple OTC medications including theraflu, sudafed and flonase.  Will add afrin twice daily and claritin to help with sinus drainage.  I have had long discussion with patient about indications for antibitoics in bacterial sinus infection.  Pt has no facial tendenress, no fever.  I do not feel this is an  acute bacterial sinusitis at this time.  Pt seems unhappy with this asessment.  I have asked her if she has any questions, she says no, but by body language seems to be dissatisfied. I have made every effort to discuss with her the difference in viral versus bacterial sinus infections.  She has also declined to have strep test performed.    Discharged with strict return precautions.  Pt agreeable with plan.  Of note, in reviewing patients chart she was seen in 2013 with similar symptoms, conservative management was recommended at that time as well.      Threasa Beards, MD 01/17/14 [phone]

## 2014-01-15 NOTE — ED Notes (Signed)
Pt refusing strep screen at this time. Reports "I know I don't have strep." Pt sts "the doctor hasn't even been in here to see me."

## 2015-01-22 ENCOUNTER — Ambulatory Visit: Payer: Self-pay | Admitting: Podiatry

## 2019-12-20 ENCOUNTER — Encounter (HOSPITAL_BASED_OUTPATIENT_CLINIC_OR_DEPARTMENT_OTHER): Payer: Self-pay | Admitting: Respiratory Therapy

## 2019-12-20 ENCOUNTER — Emergency Department (HOSPITAL_BASED_OUTPATIENT_CLINIC_OR_DEPARTMENT_OTHER): Payer: BC Managed Care – PPO

## 2019-12-20 ENCOUNTER — Other Ambulatory Visit: Payer: Self-pay

## 2019-12-20 ENCOUNTER — Emergency Department (HOSPITAL_BASED_OUTPATIENT_CLINIC_OR_DEPARTMENT_OTHER)
Admission: EM | Admit: 2019-12-20 | Discharge: 2019-12-20 | Disposition: A | Discharge status: Home / Self Care | Payer: BC Managed Care – PPO | Attending: Emergency Medicine | Admitting: Emergency Medicine

## 2019-12-20 DIAGNOSIS — J9801 Acute bronchospasm: Secondary | ICD-10-CM

## 2019-12-20 DIAGNOSIS — R0602 Shortness of breath: Secondary | ICD-10-CM | POA: Diagnosis present

## 2019-12-20 MED ORDER — ALBUTEROL SULFATE HFA 108 (90 BASE) MCG/ACT IN AERS
8.0000 | INHALATION_SPRAY | Freq: Once | RESPIRATORY_TRACT | Status: AC
  Administered 2019-12-20: 8 via RESPIRATORY_TRACT
  Filled 2019-12-20: qty 6.7

## 2019-12-20 MED ORDER — DEXAMETHASONE SODIUM PHOSPHATE 10 MG/ML IJ SOLN
10.0000 mg | Freq: Once | INTRAMUSCULAR | Status: AC
  Administered 2019-12-20: 10 mg via INTRAMUSCULAR
  Filled 2019-12-20: qty 1

## 2019-12-20 NOTE — ED Provider Notes (Signed)
[facility] Provider Note: Georgena Spurling, MD, FACEP  CSN: [id number] MRN: [id number] ARRIVAL: 12/20/19 at [place]: [place]  Shortness of Breath   HISTORY OF PRESENT ILLNESS  12/20/19 2:01 AM Phyllis Collins is a 54 y.o. female who has had cough and "sinus issues") nasal congestion, sinus pressure) for 2 months.  She is here with shortness of breath, wheezing and chest discomfort (described as a tightness and rated 10 out of 10, worse with heavy breathing) since yesterday.  She was given 8 puffs of albuterol prior to my evaluation with relief of the wheezing and tightness.  It has made her agitated and tremulous however.  She has not had a fever.  She has no history of asthma.   History reviewed. No pertinent past medical history.  Past Surgical History:  Procedure Laterality Date   CESAREAN SECTION     FOOT SURGERY      Family History  Problem Relation Age of Onset   Sudden death Father    Heart attack Father    Hypertension Paternal Grandmother    Diabetes Neg Hx    Hyperlipidemia Neg Hx     Social History   Tobacco Use   Smoking status: Never Smoker  Substance Use Topics   Alcohol use: Yes    Comment: occ   Drug use: No    Prior to Admission medications   Not on File    Allergies Nitrofurantoin monohyd macro   REVIEW OF SYSTEMS  Negative except as noted here or in the History of Present Illness.   PHYSICAL EXAMINATION  Initial Vital Signs Blood pressure 132/70, pulse (!) 102, temperature 98.3 F (36.8 C), temperature source Oral, resp. rate (!) 22, height 5\' 2"  (1.575 m), weight 64.4 kg, SpO2 98 %.  Examination General: Well-developed, well-nourished female in no acute distress; appearance consistent with age of record HENT: normocephalic; atraumatic Eyes: pupils equal, round and reactive to light; extraocular muscles intact Neck: supple Heart: regular rate and rhythm; tachycardia Lungs: clear to  auscultation bilaterally Abdomen: soft; nondistended; nontender; bowel sounds present Extremities: No deformity; full range of motion; pulses normal Neurologic: Awake, alert and oriented; motor function intact in all extremities and symmetric; no facial droop; tremulous Skin: Warm and dry Psychiatric: Normal mood and affect   RESULTS  Summary of this visit's results, reviewed and interpreted by myself:   EKG Interpretation  Date/Time:  Thursday December 20 2019 00:38:10 EDT Ventricular Rate:  102 PR Interval:    QRS Duration: 76 QT Interval:  333 QTC Calculation: 434 R Axis:   80 Text Interpretation: Sinus tachycardia No previous ECGs available Confirmed by Dessie Delcarlo [phone]) on 12/20/2019 1:17:29 AM      Laboratory Studies: No results found for this or any previous visit (from the past 24 hour(s)). Imaging Studies: DG Chest Portable 1 View  Result Date: 12/20/2019 CLINICAL DATA:  Cough, increasing shortness of breath today EXAM: PORTABLE CHEST 1 VIEW COMPARISON:  None. FINDINGS: The heart size and mediastinal contours are within normal limits. Both lungs are clear. The visualized skeletal structures are unremarkable. IMPRESSION: No active disease. Electronically Signed   By: Randa Ngo M.D.   On: 12/20/2019 01:15    ED COURSE and MDM  Nursing notes, initial and subsequent vitals signs, including pulse oximetry, reviewed and interpreted by myself.  Vitals:   12/20/19 0036 12/20/19 0039 12/20/19 0053  BP:  132/70   Pulse:  (!) 102   Resp:  (!) 22  Temp:  98.3 F (36.8 C)   TempSrc:  Oral   SpO2:  98% 98%  Weight: 64.4 kg    Height: 5\' 2"  (1.575 m)     Medications  dexamethasone (DECADRON) injection 10 mg (has no administration in time range)  albuterol (VENTOLIN HFA) 108 (90 Base) MCG/ACT inhaler 8 puff (8 puffs Inhalation Given 12/20/19 0047)   Patient's lungs are clear at this time.  She was given a dose of dexamethasone IM.  Her symptoms today related to allergies.   She will be sent home with an albuterol inhaler and instructed in its use.   PROCEDURES  Procedures   ED DIAGNOSES     ICD-10-CM   1. Acute bronchospasm  J98.01        Druscilla Petsch, Jenny Reichmann, MD 12/20/19 [phone]

## 2019-12-20 NOTE — ED Triage Notes (Signed)
Pt reports cough and sinus issues x 2 months with increased shob today.

## 2020-03-10 ENCOUNTER — Emergency Department (HOSPITAL_COMMUNITY)
Admission: EM | Admit: 2020-03-10 | Discharge: 2020-03-10 | Disposition: A | Discharge status: Home / Self Care | Payer: BC Managed Care – PPO | Attending: Emergency Medicine | Admitting: Emergency Medicine

## 2020-03-10 ENCOUNTER — Encounter (HOSPITAL_COMMUNITY): Payer: Self-pay

## 2020-03-10 ENCOUNTER — Emergency Department (HOSPITAL_COMMUNITY): Payer: BC Managed Care – PPO

## 2020-03-10 ENCOUNTER — Other Ambulatory Visit: Payer: Self-pay

## 2020-03-10 DIAGNOSIS — R519 Headache, unspecified: Secondary | ICD-10-CM | POA: Insufficient documentation

## 2020-03-10 DIAGNOSIS — R000 Tachycardia, unspecified: Secondary | ICD-10-CM | POA: Insufficient documentation

## 2020-03-10 DIAGNOSIS — Z20822 Contact with and (suspected) exposure to covid-19: Secondary | ICD-10-CM | POA: Insufficient documentation

## 2020-03-10 DIAGNOSIS — R0602 Shortness of breath: Secondary | ICD-10-CM | POA: Diagnosis not present

## 2020-03-10 DIAGNOSIS — R0789 Other chest pain: Secondary | ICD-10-CM | POA: Insufficient documentation

## 2020-03-10 DIAGNOSIS — R0981 Nasal congestion: Secondary | ICD-10-CM | POA: Diagnosis not present

## 2020-03-10 DIAGNOSIS — G8929 Other chronic pain: Secondary | ICD-10-CM | POA: Diagnosis not present

## 2020-03-10 DIAGNOSIS — Z79899 Other long term (current) drug therapy: Secondary | ICD-10-CM | POA: Insufficient documentation

## 2020-03-10 LAB — CBC WITH DIFFERENTIAL/PLATELET
Abs Immature Granulocytes: 0.02 10*3/uL (ref 0.00–0.07)
Basophils Absolute: 0 10*3/uL (ref 0.0–0.1)
Basophils Relative: 1 %
Eosinophils Absolute: 0.2 10*3/uL (ref 0.0–0.5)
Eosinophils Relative: 3 %
HCT: 46.7 % — ABNORMAL HIGH (ref 36.0–46.0)
Hemoglobin: 15.1 g/dL — ABNORMAL HIGH (ref 12.0–15.0)
Immature Granulocytes: 0 %
Lymphocytes Relative: 17 %
Lymphs Abs: 1 10*3/uL (ref 0.7–4.0)
MCH: 30 pg (ref 26.0–34.0)
MCHC: 32.3 g/dL (ref 30.0–36.0)
MCV: 92.7 fL (ref 80.0–100.0)
Monocytes Absolute: 0.5 10*3/uL (ref 0.1–1.0)
Monocytes Relative: 7 %
Neutro Abs: 4.6 10*3/uL (ref 1.7–7.7)
Neutrophils Relative %: 72 %
Platelets: 305 10*3/uL (ref 150–400)
RBC: 5.04 MIL/uL (ref 3.87–5.11)
RDW: 12.4 % (ref 11.5–15.5)
WBC: 6.3 10*3/uL (ref 4.0–10.5)
nRBC: 0 % (ref 0.0–0.2)

## 2020-03-10 LAB — BASIC METABOLIC PANEL
Anion gap: 8 (ref 5–15)
BUN: 12 mg/dL (ref 6–20)
CO2: 27 mmol/L (ref 22–32)
Calcium: 8.8 mg/dL — ABNORMAL LOW (ref 8.9–10.3)
Chloride: 105 mmol/L (ref 98–111)
Creatinine, Ser: 0.78 mg/dL (ref 0.44–1.00)
GFR calc Af Amer: >60 mL/min (ref >60)
GFR calc non Af Amer: >60 mL/min (ref >60)
Glucose, Bld: 86 mg/dL (ref 70–99)
Potassium: 3.6 mmol/L (ref 3.5–5.1)
Sodium: 140 mmol/L (ref 135–145)

## 2020-03-10 LAB — SARS CORONAVIRUS 2 BY RT PCR (HOSPITAL ORDER, PERFORMED IN ~~LOC~~ HOSPITAL LAB): SARS Coronavirus 2: NEGATIVE

## 2020-03-10 LAB — TROPONIN I (HIGH SENSITIVITY)
Troponin I (High Sensitivity): 4 ng/L (ref <18)
Troponin I (High Sensitivity): 3 ng/L (ref <18)

## 2020-03-10 LAB — D-DIMER, QUANTITATIVE: D-Dimer, Quant: 0.91 ug/mL-FEU — ABNORMAL HIGH (ref 0.00–0.50)

## 2020-03-10 MED ORDER — IOHEXOL 350 MG/ML SOLN
100.0000 mL | Freq: Once | INTRAVENOUS | Status: AC | PRN
  Administered 2020-03-10: 100 mL via INTRAVENOUS

## 2020-03-10 MED ORDER — ALBUTEROL SULFATE HFA 108 (90 BASE) MCG/ACT IN AERS
2.0000 | INHALATION_SPRAY | Freq: Once | RESPIRATORY_TRACT | Status: AC
  Administered 2020-03-10: 2 via RESPIRATORY_TRACT
  Filled 2020-03-10: qty 6.7

## 2020-03-10 NOTE — ED Provider Notes (Signed)
[facility] DEPT Provider Note   CSN: [id number] Arrival date & time: 03/10/20  1414     History Chief Complaint  Patient presents with  . Shortness of Breath  . Wheezing    Phyllis Collins is a 54 y.o. female.  Presents to ER with concern for shortness of breath.  Patient reports that she has chronic congestion, for the past year but this seems to be worse lately.  Also for the last day or so has been having dull achy headache associated with the congestion.  Not worse headache of her life, not sudden onset.  Today when she was at [facility], she felt sudden onset of shortness of breath and chest pressure.  Central, moderate to severe, not associated with exertion, no change with rest.  Lasted less than 1 hour.  Still feels mildly short of breath but no ongoing chest pressure.  Nonradiating.  She believes her father died of heart attack at age 66, unsure of details.  She denies any known personal history of cardiac disease, lung disease.  Denies history of smoking.  HPI     History reviewed. No pertinent past medical history.  Patient Active Problem List   Diagnosis Date Noted  . Right shoulder pain 04/09/2011    Past Surgical History:  Procedure Laterality Date  . CESAREAN SECTION    . FOOT SURGERY       OB History   No obstetric history on file.     Family History  Problem Relation Age of Onset  . Sudden death Father   . Heart attack Father   . Hypertension Paternal Grandmother   . High Cholesterol Mother   . Hypertension Mother   . Diabetes Neg Hx   . Hyperlipidemia Neg Hx     Social History   Tobacco Use  . Smoking status: Never Smoker  . Smokeless tobacco: Never Used  Vaping Use  . Vaping Use: Never used  Substance Use Topics  . Alcohol use: Yes    Comment: occ  . Drug use: No    Home Medications Prior to Admission medications   Medication Sig Start Date End Date Taking? Authorizing Provider  fluticasone  (FLONASE) 50 MCG/ACT nasal spray Place 1 spray into both nostrils daily as needed for allergies or rhinitis.   Yes [provider]    Allergies    Nitrofurantoin monohyd macro  Review of Systems   Review of Systems  Constitutional: Negative for chills and fever.  HENT: Positive for congestion. Negative for ear pain and sore throat.   Eyes: Negative for pain and visual disturbance.  Respiratory: Positive for chest tightness, shortness of breath and wheezing. Negative for cough.   Cardiovascular: Positive for chest pain. Negative for palpitations.  Gastrointestinal: Negative for abdominal pain and vomiting.  Genitourinary: Negative for dysuria and hematuria.  Musculoskeletal: Negative for arthralgias and back pain.  Skin: Negative for color change and rash.  Neurological: Negative for seizures and syncope.  All other systems reviewed and are negative.   Physical Exam Updated Vital Signs BP 118/69   Pulse 71   Temp 98.2 F (36.8 C) (Oral)   Resp 17   Ht 5\' 2"  (1.575 m)   Wt 64.9 kg   LMP 03/10/2020   SpO2 100%   BMI 26.16 kg/m   Physical Exam Vitals and nursing note reviewed.  Constitutional:      General: She is not in acute distress.    Appearance: She is well-developed.  HENT:  Head: Normocephalic and atraumatic.  Eyes:     Conjunctiva/sclera: Conjunctivae normal.  Cardiovascular:     Rate and Rhythm: Regular rhythm. Tachycardia present.     Heart sounds: No murmur heard.   Pulmonary:     Effort: Pulmonary effort is normal. No tachypnea, bradypnea or respiratory distress.     Breath sounds: Normal breath sounds.  Abdominal:     Palpations: Abdomen is soft.     Tenderness: There is no abdominal tenderness.  Musculoskeletal:     Cervical back: Neck supple.     Right lower leg: No edema.     Left lower leg: No edema.  Skin:    General: Skin is warm and dry.  Neurological:     General: No focal deficit present.     Mental Status: She is alert and  oriented to person, place, and time.  Psychiatric:        Mood and Affect: Mood normal.        Behavior: Behavior normal.     ED Results / Procedures / Treatments   Labs (all labs ordered are listed, but only abnormal results are displayed) Labs Reviewed  CBC WITH DIFFERENTIAL/PLATELET - Abnormal; Notable for the following components:      Result Value   Hemoglobin 15.1 (*)    HCT 46.7 (*)    All other components within normal limits  BASIC METABOLIC PANEL - Abnormal; Notable for the following components:   Calcium 8.8 (*)    All other components within normal limits  D-DIMER, QUANTITATIVE (NOT AT [facility]) - Abnormal; Notable for the following components:   D-Dimer, Quant 0.91 (*)    All other components within normal limits  SARS CORONAVIRUS 2 BY RT PCR (HOSPITAL ORDER, [facility])  TROPONIN I (HIGH SENSITIVITY)  TROPONIN I (HIGH SENSITIVITY)    EKG EKG Interpretation  Date/Time:  Monday March 10 2020 14:36:59 EDT Ventricular Rate:  114 PR Interval:    QRS Duration: 68 QT Interval:  299 QTC Calculation: 412 R Axis:   78 Text Interpretation: Sinus tachycardia Probable anteroseptal infarct, old Borderline repolarization abnormality 12 Lead; Mason-Likar Confirmed by Madalyn Rob [phone]) on 03/10/2020 4:07:58 PM   Radiology DG Chest 2 View  Result Date: 03/10/2020 CLINICAL DATA:  Shortness of breath. EXAM: CHEST - 2 VIEW COMPARISON:  None. FINDINGS: The heart size and mediastinal contours are within normal limits. Both lungs are clear. No pneumothorax or pleural effusion is noted. The visualized skeletal structures are unremarkable. IMPRESSION: No active cardiopulmonary disease. Electronically Signed   By: Marijo Conception M.D.   On: 03/10/2020 15:21   CT Angio Chest PE W and/or Wo Contrast  Result Date: 03/10/2020 CLINICAL DATA:  Shortness of breath and elevated D-dimer EXAM: CT ANGIOGRAPHY CHEST WITH CONTRAST TECHNIQUE: Multidetector CT  imaging of the chest was performed using the standard protocol during bolus administration of intravenous contrast. Multiplanar CT image reconstructions and MIPs were obtained to evaluate the vascular anatomy. CONTRAST:  171mL OMNIPAQUE IOHEXOL 350 MG/ML SOLN COMPARISON:  Chest x-ray from earlier in the same day. FINDINGS: Cardiovascular: Thoracic aorta and its branches are within normal limits. No aneurysmal dilatation or dissection is seen. No cardiac enlargement is noted. No significant coronary calcifications noted. The pulmonary artery shows a normal branching pattern. No intraluminal filling defects are identified to suggest pulmonary emboli. Mediastinum/Nodes: Thoracic inlet is within normal limits. No sizable hilar or mediastinal adenopathy is noted. The esophagus as visualized is within normal limits. Scattered  small symmetrical axillary lymph nodes are seen with normal fatty hila. Lungs/Pleura: The lungs are well aerated bilaterally. No focal infiltrate or sizable effusion is seen. A tiny less than 5 mm nodule is noted in the anterior aspect of the right upper lobe best seen on image number 51 of series 6. No other sizable nodules are seen. Upper Abdomen: Hypodensity is noted in the medial aspect of the right lobe of the liver likely representing cyst. This is incompletely evaluated on this exam. The remainder of the upper abdomen is within normal limits. Musculoskeletal: No chest wall abnormality. No acute or significant osseous findings. Review of the MIP images confirms the above findings. IMPRESSION: No evidence of pulmonary emboli. Less than 5 mm nodule in the anterior aspect of the right upper lobe. No follow-up needed if patient is low-risk. Non-contrast chest CT can be considered in 12 months if patient is high-risk. This recommendation follows the consensus statement: Guidelines for Management of Incidental Pulmonary Nodules Detected on CT Images: From the Fleischner Society 2017; Radiology 2017;  284:228-243. Hypodensity within the medial aspect of the right lobe of the liver most consistent with a cyst but incompletely evaluated on this exam. Electronically Signed   By: Inez Catalina M.D.   On: 03/10/2020 19:38    Procedures Procedures (including critical care time)  Medications Ordered in ED Medications  albuterol (VENTOLIN HFA) 108 (90 Base) MCG/ACT inhaler 2 puff (2 puffs Inhalation Given 03/10/20 1624)  iohexol (OMNIPAQUE) 350 MG/ML injection 100 mL (100 mLs Intravenous Contrast Given 03/10/20 1923)    ED Course  I have reviewed the triage vital signs and the nursing notes.  Pertinent labs & imaging results that were available during my care of the patient were reviewed by me and considered in my medical decision making (see chart for details).    MDM Rules/Calculators/A&P                         54 year old lady presenting to the emergency room with complaints of shortness of breath.  On exam, patient noted to be well-appearing in no distress.  Initially, mildly tachycardic but otherwise stable vital signs.  EKG without ischemic change, troponin within normal limits, doubt ACS.  Chest x-ray negative.  Given symptomatology, check D-dimer, mildly elevated.  CTA chest was negative for acute pulmonary embolism.  There was a small pulmonary nodule, patient is a non-smoker and therefore per radiology recommendations does not need further follow-up.  Discussed this finding with patient and recommended discussing further with primary doctor.  Reviewed return precautions.    After the discussed management above, the patient was determined to be safe for discharge.  The patient was in agreement with this plan and all questions regarding their care were answered.  ED return precautions were discussed and the patient will return to the ED with any significant worsening of condition.    Final Clinical Impression(s) / ED Diagnoses Final diagnoses:  Shortness of breath    Rx / DC  Orders ED Discharge Orders    None       Lucrezia Starch, MD 03/10/20 2023

## 2020-03-10 NOTE — Discharge Instructions (Addendum)
Recommend follow-up with your primary doctor regarding your symptoms today.  If you develop worsening shortness of breath, chest pain, passing out or other new concerning symptom, recommend return to ER for reassessment.  The radiologist commented on a small pulmonary nodule; recommend discussing results of your CT scan with your primary care doctor.

## 2020-03-10 NOTE — ED Triage Notes (Signed)
Patient c/o SOB and expiratory wheezing that started today. Patient denies history of asthma.

## 2020-05-04 ENCOUNTER — Observation Stay
Admission: EM | Admit: 2020-05-04 | Discharge: 2020-05-05 | Disposition: A | Discharge status: Home / Self Care | Payer: 59 | Attending: Internal Medicine | Admitting: Internal Medicine

## 2020-05-04 ENCOUNTER — Observation Stay: Payer: 59

## 2020-05-04 ENCOUNTER — Encounter: Payer: Self-pay | Admitting: Internal Medicine

## 2020-05-04 ENCOUNTER — Emergency Department: Payer: 59

## 2020-05-04 ENCOUNTER — Other Ambulatory Visit: Payer: Self-pay

## 2020-05-04 DIAGNOSIS — J4551 Severe persistent asthma with (acute) exacerbation: Secondary | ICD-10-CM

## 2020-05-04 DIAGNOSIS — J9601 Acute respiratory failure with hypoxia: Secondary | ICD-10-CM | POA: Diagnosis not present

## 2020-05-04 DIAGNOSIS — R0602 Shortness of breath: Secondary | ICD-10-CM | POA: Diagnosis present

## 2020-05-04 DIAGNOSIS — Z20822 Contact with and (suspected) exposure to covid-19: Secondary | ICD-10-CM | POA: Insufficient documentation

## 2020-05-04 DIAGNOSIS — J9691 Respiratory failure, unspecified with hypoxia: Secondary | ICD-10-CM | POA: Diagnosis present

## 2020-05-04 DIAGNOSIS — R0902 Hypoxemia: Secondary | ICD-10-CM

## 2020-05-04 LAB — BASIC METABOLIC PANEL
Anion gap: 11 (ref 5–15)
BUN: 10 mg/dL (ref 6–20)
CO2: 28 mmol/L (ref 22–32)
Calcium: 8.7 mg/dL — ABNORMAL LOW (ref 8.9–10.3)
Chloride: 101 mmol/L (ref 98–111)
Creatinine, Ser: 0.68 mg/dL (ref 0.44–1.00)
GFR, Estimated: >60 mL/min (ref >60)
Glucose, Bld: 131 mg/dL — ABNORMAL HIGH (ref 70–99)
Potassium: 3.1 mmol/L — ABNORMAL LOW (ref 3.5–5.1)
Sodium: 140 mmol/L (ref 135–145)

## 2020-05-04 LAB — RESPIRATORY PANEL BY RT PCR (FLU A&B, COVID)
Influenza A by PCR: NEGATIVE
Influenza B by PCR: NEGATIVE
SARS Coronavirus 2 by RT PCR: NEGATIVE

## 2020-05-04 LAB — CBC
HCT: 42.1 % (ref 36.0–46.0)
Hemoglobin: 13.7 g/dL (ref 12.0–15.0)
MCH: 30 pg (ref 26.0–34.0)
MCHC: 32.5 g/dL (ref 30.0–36.0)
MCV: 92.1 fL (ref 80.0–100.0)
Platelets: 288 10*3/uL (ref 150–400)
RBC: 4.57 MIL/uL (ref 3.87–5.11)
RDW: 12.2 % (ref 11.5–15.5)
WBC: 4.6 10*3/uL (ref 4.0–10.5)
nRBC: 0 % (ref 0.0–0.2)

## 2020-05-04 LAB — FIBRIN DERIVATIVES D-DIMER (ARMC ONLY): Fibrin derivatives D-dimer (ARMC): 857.01 ng/mL (FEU) — ABNORMAL HIGH (ref 0.00–499.00)

## 2020-05-04 LAB — BRAIN NATRIURETIC PEPTIDE: B Natriuretic Peptide: 16.1 pg/mL (ref 0.0–100.0)

## 2020-05-04 LAB — BLOOD GAS, VENOUS

## 2020-05-04 MED ORDER — PANTOPRAZOLE SODIUM 40 MG IV SOLR
40.0000 mg | Freq: Two times a day (BID) | INTRAVENOUS | Status: DC
  Administered 2020-05-05 (×2): 40 mg via INTRAVENOUS
  Filled 2020-05-04 (×2): qty 40

## 2020-05-04 MED ORDER — IOHEXOL 350 MG/ML SOLN
75.0000 mL | Freq: Once | INTRAVENOUS | Status: AC | PRN
  Administered 2020-05-04: 75 mL via INTRAVENOUS

## 2020-05-04 MED ORDER — DIPHENHYDRAMINE HCL 50 MG/ML IJ SOLN
INTRAMUSCULAR | Status: AC
  Administered 2020-05-04: 25 mg via INTRAVENOUS
  Filled 2020-05-04: qty 1

## 2020-05-04 MED ORDER — ALBUTEROL SULFATE (2.5 MG/3ML) 0.083% IN NEBU
INHALATION_SOLUTION | RESPIRATORY_TRACT | Status: AC
  Administered 2020-05-04: 7.5 mg via RESPIRATORY_TRACT
  Filled 2020-05-04: qty 12

## 2020-05-04 MED ORDER — DIPHENHYDRAMINE HCL 50 MG/ML IJ SOLN: 25.0000 mg | Freq: Once | INTRAMUSCULAR | Status: AC

## 2020-05-04 MED ORDER — SODIUM CHLORIDE 0.9 % IV SOLN: 250.0000 mL | INTRAVENOUS | Status: DC | PRN

## 2020-05-04 MED ORDER — ALBUTEROL SULFATE (2.5 MG/3ML) 0.083% IN NEBU
7.5000 mg | INHALATION_SOLUTION | RESPIRATORY_TRACT | Status: AC
  Administered 2020-05-04: 7.5 mg via RESPIRATORY_TRACT

## 2020-05-04 MED ORDER — SODIUM CHLORIDE 0.9% FLUSH
3.0000 mL | Freq: Two times a day (BID) | INTRAVENOUS | Status: DC
  Administered 2020-05-04 – 2020-05-05 (×2): 3 mL via INTRAVENOUS

## 2020-05-04 MED ORDER — SODIUM CHLORIDE 0.9% FLUSH: 3.0000 mL | INTRAVENOUS | Status: DC | PRN

## 2020-05-04 MED ORDER — METHYLPREDNISOLONE SODIUM SUCC 125 MG IJ SOLR: 125.0000 mg | INTRAMUSCULAR | Status: AC

## 2020-05-04 MED ORDER — FAMOTIDINE IN NACL 20-0.9 MG/50ML-% IV SOLN
20.0000 mg | Freq: Once | INTRAVENOUS | Status: AC
  Administered 2020-05-04: 20 mg via INTRAVENOUS
  Filled 2020-05-04: qty 50

## 2020-05-04 MED ORDER — SUCRALFATE 1 GM/10ML PO SUSP
1.0000 g | Freq: Three times a day (TID) | ORAL | Status: DC
  Administered 2020-05-05 (×2): 1 g via ORAL
  Filled 2020-05-04 (×6): qty 10

## 2020-05-04 MED ORDER — METHYLPREDNISOLONE SODIUM SUCC 40 MG IJ SOLR
40.0000 mg | Freq: Two times a day (BID) | INTRAMUSCULAR | Status: DC
  Administered 2020-05-05 (×2): 40 mg via INTRAVENOUS
  Filled 2020-05-04 (×2): qty 1

## 2020-05-04 MED ORDER — METHYLPREDNISOLONE SODIUM SUCC 125 MG IJ SOLR
INTRAMUSCULAR | Status: AC
  Administered 2020-05-04: 125 mg via INTRAVENOUS
  Filled 2020-05-04: qty 2

## 2020-05-04 MED ORDER — IPRATROPIUM-ALBUTEROL 0.5-2.5 (3) MG/3ML IN SOLN
3.0000 mL | Freq: Once | RESPIRATORY_TRACT | Status: AC
  Administered 2020-05-04: 3 mL via RESPIRATORY_TRACT
  Filled 2020-05-04: qty 3

## 2020-05-04 MED ORDER — HEPARIN SODIUM (PORCINE) 5000 UNIT/ML IJ SOLN
5000.0000 [IU] | Freq: Three times a day (TID) | INTRAMUSCULAR | Status: DC
  Administered 2020-05-04 – 2020-05-05 (×2): 5000 [IU] via SUBCUTANEOUS
  Filled 2020-05-04 (×2): qty 1

## 2020-05-04 MED ORDER — MAGNESIUM SULFATE 2 GM/50ML IV SOLN
2.0000 g | Freq: Once | INTRAVENOUS | Status: AC
  Administered 2020-05-04: 2 g via INTRAVENOUS
  Filled 2020-05-04: qty 50

## 2020-05-04 NOTE — ED Notes (Signed)
First Nurse Note: To ambulatory into ED c/o sudden onset shortness of breath. Pt has audible wheezing, SpO2 sats 77-87% on room air. Charge RN called for bed and pt taken to room 9

## 2020-05-04 NOTE — ED Provider Notes (Signed)
[facility] Emergency Department Provider Note   ____________________________________________   First MD Initiated Contact with Patient 05/04/20 1654     (approximate)  I have reviewed the triage vital signs and the nursing notes.   HISTORY  Chief Complaint Allergic Reaction  EM caveat: Severe respiratory distress, hypoxia, tripoding, severe respiratory distress  HPI Phyllis Collins is a 54 y.o. female here for evaluation of difficulty breathing  Patient reports that she was feeling pretty well but is been diagnosed and using albuterol for about a month for possible asthma.  She also had throat congestion seeing her ENT for this for about a month.  Then today she took an ibuprofen tablet and suddenly started feeling short of breath.   Denies chest pain.  Does feel tight.  Also her throat feels tight.  Feels like there might be a little bit of swelling in her throat  No past medical history on file.  Patient Active Problem List   Diagnosis Date Noted  . Right shoulder pain 04/09/2011    Past Surgical History:  Procedure Laterality Date  . CESAREAN SECTION    . FOOT SURGERY      Prior to Admission medications   Medication Sig Start Date End Date Taking? Authorizing Provider  fluticasone (FLONASE) 50 MCG/ACT nasal spray Place 1 spray into both nostrils daily as needed for allergies or rhinitis.    [provider]    Allergies Nitrofurantoin monohyd macro  Family History  Problem Relation Age of Onset  . Sudden death Father   . Heart attack Father   . Hypertension Paternal Grandmother   . High Cholesterol Mother   . Hypertension Mother   . Diabetes Neg Hx   . Hyperlipidemia Neg Hx     Social History Social History   Tobacco Use  . Smoking status: Never Smoker  . Smokeless tobacco: Never Used  Vaping Use  . Vaping Use: Never used  Substance Use Topics  . Alcohol use: Yes    Comment: occ  . Drug use: No     Review of Systems  EM caveat    ____________________________________________   PHYSICAL EXAM:  VITAL SIGNS: ED Triage Vitals  Enc Vitals Group     BP 05/04/20 1658 (!) 161/80     Pulse Rate 05/04/20 1658 (!) 126     Resp 05/04/20 1658 (!) 28     Temp 05/04/20 1658 97.6 F (36.4 C)     Temp Source 05/04/20 1658 Axillary     SpO2 05/04/20 1658 94 %     Weight 05/04/20 1702 133 lb (60.3 kg)     Height 05/04/20 1702 5\' 2"  (1.575 m)     Head Circumference --      Peak Flow --      Pain Score 05/04/20 1701 8     Pain Loc --      Pain Edu? --      Excl. in [place]? --     Constitutional: Alert and oriented.  Tripoding, sitting in wheelchair, appears quite in distress.  Audible expiratory wheezing and hoarse sounds with respiration Eyes: Conjunctivae are normal. Head: Atraumatic. Nose: No congestion/rhinnorhea.  No oral pharyngeal edema. Mouth/Throat: Mucous membranes are moist. Neck: No stridor.  Cardiovascular: Slightly tachycardic, grossly normal heart sounds good peripheral circulation. Respiratory: Moderate end expiratory wheezing, sitting upright tripoding with obvious respiratory distress. Gastrointestinal: Soft and nontender. No distention. Musculoskeletal: No lower extremity tenderness nor edema. Neurologic:  Normal speech and language. No  gross focal neurologic deficits are appreciated.  Skin:  Skin is warm, dry and intact. No rash noted.  No hives or urticaria Psychiatric: Mood and affect are normal. Speech and behavior are normal.  ____________________________________________   LABS (all labs ordered are listed, but only abnormal results are displayed)  Labs Reviewed  BASIC METABOLIC PANEL - Abnormal; Notable for the following components:      Result Value   Potassium 3.1 (*)    Glucose, Bld 131 (*)    Calcium 8.7 (*)    All other components within normal limits  RESPIRATORY PANEL BY RT PCR (FLU A&B, COVID)  CBC    ____________________________________________  EKG  Reviewed entered by me at 1655 Heart rate 120 QRS 90 QTc 440 Sinus tachycardia mild nonspecific T wave abnormality ____________________________________________  RADIOLOGY   DG Chest Portable 1 View  Result Date: 05/04/2020 CLINICAL DATA:  Shortness of breath.  Wheezing.  Hypoxia. EXAM: PORTABLE CHEST 1 VIEW COMPARISON:  03/10/2020 FINDINGS: The heart size and mediastinal contours are within normal limits. Both lungs are clear. The visualized skeletal structures are unremarkable. IMPRESSION: No active disease. Electronically Signed   By: Marlaine Hind M.D.   On: 05/04/2020 17:53   Chest x-ray reviewed negative for acute  ____________________________________________   PROCEDURES  Procedure(s) performed: None  Procedures  Critical Care performed: Yes, see critical care note(s)  CRITICAL CARE Performed by: Delman Kitten   Total critical care time: 35 minutes  Critical care time was exclusive of separately billable procedures and treating other patients.  Critical care was necessary to treat or prevent imminent or life-threatening deterioration.  Critical care was time spent personally by me on the following activities: development of treatment plan with patient and/or surrogate as well as nursing, discussions with consultants, evaluation of patient's response to treatment, examination of patient, obtaining history from patient or surrogate, ordering and performing treatments and interventions, ordering and review of laboratory studies, ordering and review of radiographic studies, pulse oximetry and re-evaluation of patient's condition.  ____________________________________________   INITIAL IMPRESSION / ASSESSMENT AND PLAN / ED COURSE  Pertinent labs & imaging results that were available during my care of the patient were reviewed by me and considered in my medical decision making (see chart for details).   Patient  presents for severe respiratory distress, tripoding, history of possible diagnosis of recent asthma but also reports having taken ibuprofen with sudden development of wheezing and difficulty breathing and a full feeling in her throat.  No obvious evidence of angioedema and she does not have stridor but she does report feeling full or possible choking sensation along with severe shortness of breath.  She was not eating anything at the time  Clinical Course as of May 04 1809  Nancy Fetter May 04, 2020  1735 Patient is improving, work of breathing improving she is resting more comfortably now on 2 L nasal cannula.   [MQ]  1748 Admitted, discusedw with Dr. Posey Pronto   [MQ]    Clinical Course User Index [MQ] Delman Kitten, MD   Later after patient's respirations improved her work of breathing improved markedly after epinephrine and multiple drug doses for allergic reaction or possible reactive airway disease the patient is feeling notably better.  She does report that once in the past she took ibuprofen and had a very almost similar reaction but not as severe occurring almost immediately after taking it.  At this point counseled her and discontinuation and no longer to use nonsteroidal anti-inflammatories and to follow-up  with her ENT doctor as well for possible allergy testing.  Patient understanding agreeable with plan for admission to the hospital given mild ongoing work of breathing, concerning for reactive airway disease, asthma exacerbation, allergic reaction etc.  Overall markedly improved  ____________________________________________   FINAL CLINICAL IMPRESSION(S) / ED DIAGNOSES  Final diagnoses:  Severe persistent reactive airway disease with acute exacerbation  Hypoxia        Note:  This document was prepared using Dragon voice recognition software and may include unintentional dictation errors       Delman Kitten, MD 05/05/20 0040

## 2020-05-04 NOTE — H&P (Signed)
History and Physical    Phyllis Collins IOE:[id number] DOB: 1966/06/19 DOA: 05/04/2020  PCP: Robyne Peers, MD    Patient coming from:  Home   Chief Complaint:  SOB.   HPI: Phyllis Collins is a 54 y.o. female with no significant past medical history   Seen in ed for acute hypoxic respiratory failure. Pt states her episodes started this year and has had 3 and after taking Advil pt sees ent so not sure if pt has asthma allergy and nasal polyposis. Advised pt to stop taking advil/ nsaids/ start ppi and f/u with allergist fore evaluation of her allergies to color and meds not just environmental agents.   ED Course:  Vitals:   05/05/20 0630 05/05/20 0752 05/05/20 0759 05/05/20 1101  BP: 107/71  119/72 108/64  Pulse: 99  96 (!) 101  Resp: 16  19 18   Temp: 97.8 F (36.6 C)   98.6 F (37 C)  TempSrc: Oral   Oral  SpO2: 98% 100% 100% 100%  Weight: 60.8 kg     Height:      In ed pt was given duoneb and magnesium and imaging was negative. labs are stable.  Review of Systems:  Review of Systems  Respiratory: Positive for cough, shortness of breath and wheezing.   Cardiovascular: Positive for chest pain.  All other systems reviewed and are negative.    History reviewed. No pertinent past medical history.  Past Surgical History:  Procedure Laterality Date  . CESAREAN SECTION    . FOOT SURGERY       reports that she has never smoked. She has never used smokeless tobacco. She reports current alcohol use. She reports that she does not use drugs.  Allergies  Allergen Reactions  . Nitrofurantoin Monohyd Macro     Family History  Problem Relation Age of Onset  . Sudden death Father   . Heart attack Father   . Hypertension Paternal Grandmother   . High Cholesterol Mother   . Hypertension Mother   . Diabetes Neg Hx   . Hyperlipidemia Neg Hx     Prior to Admission medications   Medication Sig Start Date End Date Taking? Authorizing Provider    fluticasone (FLONASE) 50 MCG/ACT nasal spray Place 1 spray into both nostrils daily as needed for allergies or rhinitis.    [provider]    Physical Exam: Vitals:   05/05/20 0630 05/05/20 0752 05/05/20 0759 05/05/20 1101  BP: 107/71  119/72 108/64  Pulse: 99  96 (!) 101  Resp: 16  19 18   Temp: 97.8 F (36.6 C)   98.6 F (37 C)  TempSrc: Oral   Oral  SpO2: 98% 100% 100% 100%  Weight: 60.8 kg     Height:        Physical Exam Vitals and nursing note reviewed.  HENT:     Head: Normocephalic and atraumatic.     Right Ear: External ear normal.     Left Ear: External ear normal.     Nose: Nose normal.  Eyes:     Extraocular Movements: Extraocular movements intact.     Pupils: Pupils are equal, round, and reactive to light.  Cardiovascular:     Rate and Rhythm: Normal rate and regular rhythm.     Pulses: Normal pulses.     Heart sounds: Normal heart sounds.  Pulmonary:     Effort: Pulmonary effort is normal.     Breath sounds: Normal breath sounds. No wheezing or rhonchi.  Abdominal:     Palpations: Abdomen is soft.  Neurological:     General: No focal deficit present.     Mental Status: She is alert and oriented to person, place, and time.  Psychiatric:        Mood and Affect: Mood normal.        Behavior: Behavior normal.     Labs on Admission: I have personally reviewed following labs and imaging studies  CBC: Recent Labs  Lab 05/04/20 1653  WBC 4.6  HGB 13.7  HCT 42.1  MCV 92.1  PLT 485   Basic Metabolic Panel: Recent Labs  Lab 05/04/20 1653  NA 140  K 3.1*  CL 101  CO2 28  GLUCOSE 131*  BUN 10  CREATININE 0.68  CALCIUM 8.7*   GFR: Estimated Creatinine Clearance: 70.6 mL/min (by C-G formula based on SCr of 0.68 mg/dL). Liver Function Tests: No results for input(s): AST, ALT, ALKPHOS, BILITOT, PROT, ALBUMIN in the last 168 hours. No results for input(s): LIPASE, AMYLASE in the last 168 hours. No results for input(s): AMMONIA in the  last 168 hours. Coagulation Profile: No results for input(s): INR, PROTIME in the last 168 hours. Cardiac Enzymes: No results for input(s): CKTOTAL, CKMB, CKMBINDEX, TROPONINI in the last 168 hours. BNP (last 3 results) No results for input(s): PROBNP in the last 8760 hours. HbA1C: No results for input(s): HGBA1C in the last 72 hours. CBG: No results for input(s): GLUCAP in the last 168 hours. Lipid Profile: No results for input(s): CHOL, HDL, LDLCALC, TRIG, CHOLHDL, LDLDIRECT in the last 72 hours. Thyroid Function Tests: No results for input(s): TSH, T4TOTAL, FREET4, T3FREE, THYROIDAB in the last 72 hours. Anemia Panel: No results for input(s): VITAMINB12, FOLATE, FERRITIN, TIBC, IRON, RETICCTPCT in the last 72 hours. Urine analysis:    Component Value Date/Time   COLORURINE YELLOW 03/18/2012 1637   APPEARANCEUR CLOUDY (A) 03/18/2012 1637   LABSPEC 1.019 03/18/2012 1637   PHURINE 6.0 03/18/2012 1637   GLUCOSEU NEGATIVE 03/18/2012 1637   HGBUR SMALL (A) 03/18/2012 1637   BILIRUBINUR NEGATIVE 03/18/2012 1637   KETONESUR NEGATIVE 03/18/2012 1637   PROTEINUR NEGATIVE 03/18/2012 1637   UROBILINOGEN 1.0 03/18/2012 1637   NITRITE POSITIVE (A) 03/18/2012 1637   LEUKOCYTESUR SMALL (A) 03/18/2012 1637    Intake/Output Summary (Last 24 hours) at 05/05/2020 1358 Last data filed at 05/05/2020 1005 Gross per 24 hour  Intake 218 ml  Output --  Net 218 ml   Lab Results  Component Value Date   CREATININE 0.68 05/04/2020   CREATININE 0.78 03/10/2020    COVID-19 Labs  No results for input(s): DDIMER, FERRITIN, LDH, CRP in the last 72 hours.  Lab Results  Component Value Date   SARSCOV2NAA NEGATIVE 05/04/2020   [place] NEGATIVE 03/10/2020    Radiological Exams on Admission: CT ANGIO CHEST PE W OR WO CONTRAST  Result Date: 05/04/2020 CLINICAL DATA:  Sudden onset shortness of breath, wheezing, hypoxia EXAM: CT ANGIOGRAPHY CHEST WITH CONTRAST TECHNIQUE: Multidetector CT  imaging of the chest was performed using the standard protocol during bolus administration of intravenous contrast. Multiplanar CT image reconstructions and MIPs were obtained to evaluate the vascular anatomy. CONTRAST:  53mL OMNIPAQUE IOHEXOL 350 MG/ML SOLN COMPARISON:  05/04/2020, 03/10/2020 FINDINGS: Cardiovascular: This is a technically adequate evaluation of the pulmonary vasculature. No filling defects or pulmonary emboli. The heart is unremarkable without pericardial effusion. Normal caliber of the thoracic aorta. Mediastinum/Nodes: No enlarged mediastinal, hilar, or axillary lymph nodes. Thyroid gland, trachea, and  esophagus demonstrate no significant findings. Lungs/Pleura: No airspace disease, effusion, or pneumothorax. There is diffuse bronchial wall thickening consistent with reactive airway disease. 5 x 4 cm right upper lobe nodule is unchanged. No other pulmonary nodules or masses. Upper Abdomen: No acute abnormality. Musculoskeletal: No acute or destructive bony lesions. Reconstructed images demonstrate no additional findings. Review of the MIP images confirms the above findings. IMPRESSION: 1. No evidence of pulmonary embolus. 2. Diffuse bronchial wall thickening consistent with reactive airway disease. 3. Stable 5 mm right upper lobe pulmonary nodule. No follow-up needed if patient is low-risk. Non-contrast chest CT can be considered in 12 months if patient is high-risk. This recommendation follows the consensus statement: Guidelines for Management of Incidental Pulmonary Nodules Detected on CT Images: From the Fleischner Society 2017; Radiology 2017; 284:228-243. Electronically Signed   By: Randa Ngo M.D.   On: 05/04/2020 19:19   DG Chest Portable 1 View  Result Date: 05/04/2020 CLINICAL DATA:  Shortness of breath.  Wheezing.  Hypoxia. EXAM: PORTABLE CHEST 1 VIEW COMPARISON:  03/10/2020 FINDINGS: The heart size and mediastinal contours are within normal limits. Both lungs are clear. The  visualized skeletal structures are unremarkable. IMPRESSION: No active disease. Electronically Signed   By: Marlaine Hind M.D.   On: 05/04/2020 17:53    EKG: Independently reviewed.  sinus tach 123.    Assessment/Plan Active Problems:   Respiratory failure with hypoxia [facility]) Assessment & Plan Pt admitted for sob related to suspected asthma from nsaids allergy. Pt continued on steroids and prn albuterol.  D/d also include gerd variant asthma and so will get iv ppi. Pt advised for allergy testing as outpatient.     DVT prophylaxis:  heparin   Code Status:  Full code   Family Communication:  None at bedside   Disposition Plan:  home   Consults called:  none  Admission status: Observation.     Para Skeans MD [facility] [phone] How to contact the [facility] Attending or Consulting provider [place] or covering provider during after hours [place], for this patient?    1. Check the care team in [facility] and look for a) attending/consulting [facility] provider listed and b) the [facility] team listed 2. Log into [email or web address] and use 's universal password to access. If you do not have the password, please contact the hospital operator. 3. Locate the [facility] provider you are looking for under [facility] and page to a number that you can be directly reached. 4. If you still have difficulty reaching the provider, please page the [facility] (Director on Call) for the Hospitalists listed on amion for assistance. [email or web address] Password [email or web address] 05/05/2020, 1:58 PM

## 2020-05-05 ENCOUNTER — Observation Stay (HOSPITAL_BASED_OUTPATIENT_CLINIC_OR_DEPARTMENT_OTHER)
Admit: 2020-05-05 | Discharge: 2020-05-05 | Disposition: A | Discharge status: Home / Self Care | Payer: 59 | Attending: Internal Medicine | Admitting: Internal Medicine

## 2020-05-05 DIAGNOSIS — E876 Hypokalemia: Secondary | ICD-10-CM | POA: Diagnosis not present

## 2020-05-05 DIAGNOSIS — J9601 Acute respiratory failure with hypoxia: Secondary | ICD-10-CM

## 2020-05-05 DIAGNOSIS — J45901 Unspecified asthma with (acute) exacerbation: Secondary | ICD-10-CM | POA: Diagnosis not present

## 2020-05-05 DIAGNOSIS — Z886 Allergy status to analgesic agent status: Secondary | ICD-10-CM

## 2020-05-05 LAB — ECHOCARDIOGRAM COMPLETE
AR max vel: 0.85 cm2
AV Area VTI: 0.93 cm2
AV Area mean vel: 0.92 cm2
AV Mean grad: 12 mmHg
AV Peak grad: 23.4 mmHg
Ao pk vel: 2.42 m/s
Area-P 1/2: 8.07 cm2
Height: 62 in
S' Lateral: 2.25 cm
Weight: 2142.91 oz

## 2020-05-05 LAB — HIV ANTIBODY (ROUTINE TESTING W REFLEX): HIV Screen 4th Generation wRfx: NONREACTIVE

## 2020-05-05 MED ORDER — ACETAMINOPHEN 325 MG PO TABS
650.0000 mg | ORAL_TABLET | Freq: Four times a day (QID) | ORAL | Status: DC | PRN
  Administered 2020-05-05: 650 mg via ORAL
  Filled 2020-05-05: qty 2

## 2020-05-05 MED ORDER — UMECLIDINIUM BROMIDE 62.5 MCG/INH IN AEPB
1.0000 | INHALATION_SPRAY | Freq: Every day | RESPIRATORY_TRACT | Status: DC
  Filled 2020-05-05: qty 7

## 2020-05-05 MED ORDER — DM-GUAIFENESIN ER 30-600 MG PO TB12
1.0000 | ORAL_TABLET | Freq: Two times a day (BID) | ORAL | Status: DC
  Administered 2020-05-05: 1 via ORAL
  Filled 2020-05-05: qty 1

## 2020-05-05 MED ORDER — FLUTICASONE-UMECLIDIN-VILANT 100-62.5-25 MCG/INH IN AEPB: 1.0000 | INHALATION_SPRAY | Freq: Every day | RESPIRATORY_TRACT | Status: DC

## 2020-05-05 MED ORDER — FLUTICASONE FUROATE-VILANTEROL 100-25 MCG/INH IN AEPB
1.0000 | INHALATION_SPRAY | Freq: Every day | RESPIRATORY_TRACT | Status: DC
  Filled 2020-05-05: qty 28

## 2020-05-05 MED ORDER — POTASSIUM CHLORIDE CRYS ER 20 MEQ PO TBCR
40.0000 meq | EXTENDED_RELEASE_TABLET | Freq: Once | ORAL | Status: AC
  Administered 2020-05-05: 40 meq via ORAL
  Filled 2020-05-05: qty 2

## 2020-05-05 MED ORDER — TRAMADOL HCL 50 MG PO TABS: 50.0000 mg | ORAL_TABLET | Freq: Two times a day (BID) | ORAL | 0 refills | Status: AC | PRN

## 2020-05-05 MED ORDER — FLUTICASONE-UMECLIDIN-VILANT 100-62.5-25 MCG/INH IN AEPB
1.0000 | INHALATION_SPRAY | Freq: Every day | RESPIRATORY_TRACT | Status: DC
  Administered 2020-05-05: 1 via RESPIRATORY_TRACT
  Filled 2020-05-05 (×2): qty 1

## 2020-05-05 MED ORDER — FLUTICASONE PROPIONATE 50 MCG/ACT NA SUSP
1.0000 | Freq: Every day | NASAL | 0 refills | Status: DC
Start: 2020-05-05 — End: 2021-11-16

## 2020-05-05 MED ORDER — FLUTICASONE PROPIONATE 50 MCG/ACT NA SUSP
1.0000 | Freq: Every day | NASAL | Status: DC | PRN
  Filled 2020-05-05: qty 16

## 2020-05-05 MED ORDER — FLUTICASONE PROPIONATE 50 MCG/ACT NA SUSP
1.0000 | Freq: Every day | NASAL | Status: DC | PRN
  Administered 2020-05-05: 2 via NASAL
  Filled 2020-05-05: qty 16

## 2020-05-05 MED ORDER — DM-GUAIFENESIN ER 30-600 MG PO TB12: 1.0000 | ORAL_TABLET | Freq: Two times a day (BID) | ORAL | 0 refills | Status: AC

## 2020-05-05 MED ORDER — ENOXAPARIN SODIUM 40 MG/0.4ML ~~LOC~~ SOLN
40.0000 mg | SUBCUTANEOUS | Status: DC
  Filled 2020-05-05: qty 0.4

## 2020-05-05 MED ORDER — TRAMADOL HCL 50 MG PO TABS
50.0000 mg | ORAL_TABLET | Freq: Two times a day (BID) | ORAL | Status: DC
  Administered 2020-05-05: 50 mg via ORAL
  Filled 2020-05-05: qty 1

## 2020-05-05 MED ORDER — IPRATROPIUM-ALBUTEROL 0.5-2.5 (3) MG/3ML IN SOLN
3.0000 mL | Freq: Four times a day (QID) | RESPIRATORY_TRACT | Status: DC
  Administered 2020-05-05 (×2): 3 mL via RESPIRATORY_TRACT
  Filled 2020-05-05: qty 3

## 2020-05-05 MED ORDER — PREDNISONE 10 MG PO TABS: ORAL_TABLET | ORAL | 0 refills | Status: AC

## 2020-05-05 MED ORDER — PANTOPRAZOLE SODIUM 40 MG PO TBEC: 40.0000 mg | DELAYED_RELEASE_TABLET | Freq: Every day | ORAL | 0 refills | Status: DC

## 2020-05-05 MED ORDER — FLUTICASONE PROPIONATE 50 MCG/ACT NA SUSP: 1.0000 | Freq: Every day | NASAL | Status: DC

## 2020-05-05 NOTE — Progress Notes (Signed)
*  PRELIMINARY RESULTS* Echocardiogram 2D Echocardiogram has been performed.  Phyllis Collins 05/05/2020, 4:12 PM

## 2020-05-05 NOTE — Discharge Summary (Signed)
Discharge Summary  Phyllis Collins XLK:[id number] DOB: [birth year]  PCP: Robyne Peers, MD  Admit date: 05/04/2020 Discharge date: 05/05/2020  Time spent: 30 mins  Recommendations for Outpatient Follow-up:  1. Follow-up with PCP in 1 week 2. Follow-up with pulmonology in 1 week 3. Follow-up with ENT as scheduled   Discharge Diagnoses:  Active Hospital Problems   Diagnosis Date Noted  . Respiratory failure with hypoxia ([facility]) 05/04/2020    Resolved Hospital Problems  No resolved problems to display.    Discharge Condition: Stable  Diet recommendation: Regular diet  Vitals:   05/05/20 1355 05/05/20 1509  BP:  (!) 112/59  Pulse:  (!) 110  Resp:  19  Temp:  98.7 F (37.1 C)  SpO2: 100% 98%    History of present illness:  54 year old female with no significant past medical history except for recently diagnosed asthma currently followed by pulmonology, presents to the ED with severe respiratory distress, noted to be in acute hypoxic respiratory failure, after recently ingesting ibuprofen for headaches.  Of note, this is the third time this is happening, taking an NSAID and having significant respiratory distress.  Patient denies any new exposures, pets. Patient does report that since she moved to a new apartment about 2 years ago, she has been having issues with sinuses as well as wheezing with respiratory distress.  Patient was finally diagnosed with asthma.  Patient also sees ENT for sinuses.  In the ED, patient was noted to be in significant distress, tachycardic, abs unremarkable except for D-dimer that was elevated of which is subsequent CTA chest was done which was unremarkable for effusion, pneumothorax, pneumonia, showed stable pulmonary nodules.  Patient admitted for further management.    Today, patient denies any worsening symptoms, reports feeling much better as compared to when she first came in. Denies any chest pain, abdominal pain, nausea/vomiting,  fever/chills.  Patient advised extensively to stay away from aspirin/NSAIDs.  Patient eager to be discharged, as she does not want to miss school on 05/06/2020.  Discussed the entire discharge plans with her, and to follow-up with pulmonology, ENT and PCP in 1 week    Hospital Course:  Active Problems:   Respiratory failure with hypoxia ([facility])   Possibly NSAID/aspirin sensitive asthma with exacerbation Reactive airway disease Sinus disease Currently on room air, NAD VBG unremarkable CTA chest unremarkable for effusion, pneumothorax, pneumonia showed possible reactive airway disease, showed stable pulmonary nodules Labs/imaging unremarkable Troponin WNL, BNP WNL Echo showed EF of 60 to 36%, grade 1 diastolic dysfunction, no regional wall motion abnormality Discharged on tapered dose of steroid, Mucinex Continue home fluticasone spray, Trelegy inhaler Discussed with Dr. Luvenia Heller with pulmonology, will arrange a follow-up visit in 1 week ENT follow-up for possible ?sinus surgery as scheduled  Hypokalemia Replace as needed        Malnutrition Type:      Malnutrition Characteristics:      Nutrition Interventions:      Estimated body mass index is 24.5 kg/m as calculated from the following:   Height as of this encounter: 5\' 2"  (1.575 m).   Weight as of this encounter: 60.8 kg.    Procedures:  None  Consultations:  Discussed with pulmonology on 05/05/2020  Discharge Exam: BP (!) 112/59 (BP Location: Right Arm)   Pulse (!) 110   Temp 98.7 F (37.1 C) (Oral)   Resp 19   Ht 5\' 2"  (1.575 m)   Wt 60.8 kg   SpO2 98%   BMI 24.50  kg/m   General: NAD Cardiovascular: S1, S2 present Respiratory: CTA B    Discharge Instructions You were cared for by a hospitalist during your hospital stay. If you have any questions about your discharge medications or the care you received while you were in the hospital after you are discharged, you can call the unit and asked to  speak with the hospitalist on call if the hospitalist that took care of you is not available. Once you are discharged, your primary care physician will handle any further medical issues. Please note that NO REFILLS for any discharge medications will be authorized once you are discharged, as it is imperative that you return to your primary care physician (or establish a relationship with a primary care physician if you do not have one) for your aftercare needs so that they can reassess your need for medications and monitor your lab values.   Allergies as of 05/05/2020      Reactions   Nitrofurantoin Monohyd Macro       Medication List    TAKE these medications   albuterol 108 (90 Base) MCG/ACT inhaler Commonly known as: VENTOLIN HFA Inhale 2 puffs into the lungs every 4 (four) hours as needed for shortness of breath or wheezing.   dextromethorphan-guaiFENesin 30-600 MG 12hr tablet Commonly known as: MUCINEX DM Take 1 tablet by mouth 2 (two) times daily for 7 days.   fluticasone 50 MCG/ACT nasal spray Commonly known as: FLONASE Place 1-2 sprays into both nostrils daily. What changed:   when to take this  reasons to take this   multivitamin Liqd Take 5 mLs by mouth daily.   pantoprazole 40 MG tablet Commonly known as: Protonix Take 1 tablet (40 mg total) by mouth daily.   predniSONE 10 MG tablet Commonly known as: DELTASONE Take 4 tablets (40 mg total) by mouth daily with breakfast for 2 days, THEN 3 tablets (30 mg total) daily with breakfast for 2 days, THEN 2 tablets (20 mg total) daily with breakfast for 2 days, THEN 1 tablet (10 mg total) daily with breakfast for 2 days. Start taking on: May 05, 2020   traMADol 50 MG tablet Commonly known as: ULTRAM Take 1 tablet (50 mg total) by mouth every 12 (twelve) hours as needed for up to 5 days for moderate pain.   Trelegy Ellipta 100-62.5-25 MCG/INH Aepb Generic drug: Fluticasone-Umeclidin-Vilant Inhale 1 puff into the  lungs daily.      Allergies  Allergen Reactions  . Nitrofurantoin Monohyd Macro     Follow-up Information    Robyne Peers, MD. Schedule an appointment as soon as possible for a visit in 1 week(s).   Specialty: Family Medicine Contact information: [address] [place] [address] [phone]        Ottie Glazier, MD. Schedule an appointment as soon as possible for a visit in 1 week(s).   Specialty: Pulmonary Disease Contact information: [place] [address] [phone]                The results of significant diagnostics from this hospitalization (including imaging, microbiology, ancillary and laboratory) are listed below for reference.    Significant Diagnostic Studies: CT ANGIO CHEST PE W OR WO CONTRAST  Result Date: 05/04/2020 CLINICAL DATA:  Sudden onset shortness of breath, wheezing, hypoxia EXAM: CT ANGIOGRAPHY CHEST WITH CONTRAST TECHNIQUE: Multidetector CT imaging of the chest was performed using the standard protocol during bolus administration of intravenous contrast. Multiplanar CT image  reconstructions and MIPs were obtained to evaluate the vascular anatomy. CONTRAST:  60mL OMNIPAQUE IOHEXOL 350 MG/ML SOLN COMPARISON:  05/04/2020, 03/10/2020 FINDINGS: Cardiovascular: This is a technically adequate evaluation of the pulmonary vasculature. No filling defects or pulmonary emboli. The heart is unremarkable without pericardial effusion. Normal caliber of the thoracic aorta. Mediastinum/Nodes: No enlarged mediastinal, hilar, or axillary lymph nodes. Thyroid gland, trachea, and esophagus demonstrate no significant findings. Lungs/Pleura: No airspace disease, effusion, or pneumothorax. There is diffuse bronchial wall thickening consistent with reactive airway disease. 5 x 4 cm right upper lobe nodule is unchanged. No other pulmonary nodules or masses. Upper Abdomen: No acute abnormality. Musculoskeletal: No acute or  destructive bony lesions. Reconstructed images demonstrate no additional findings. Review of the MIP images confirms the above findings. IMPRESSION: 1. No evidence of pulmonary embolus. 2. Diffuse bronchial wall thickening consistent with reactive airway disease. 3. Stable 5 mm right upper lobe pulmonary nodule. No follow-up needed if patient is low-risk. Non-contrast chest CT can be considered in 12 months if patient is high-risk. This recommendation follows the consensus statement: Guidelines for Management of Incidental Pulmonary Nodules Detected on CT Images: From the Fleischner Society 2017; Radiology 2017; 284:228-243. Electronically Signed   By: Randa Ngo M.D.   On: 05/04/2020 19:19   DG Chest Portable 1 View  Result Date: 05/04/2020 CLINICAL DATA:  Shortness of breath.  Wheezing.  Hypoxia. EXAM: PORTABLE CHEST 1 VIEW COMPARISON:  03/10/2020 FINDINGS: The heart size and mediastinal contours are within normal limits. Both lungs are clear. The visualized skeletal structures are unremarkable. IMPRESSION: No active disease. Electronically Signed   By: Marlaine Hind M.D.   On: 05/04/2020 17:53   ECHOCARDIOGRAM COMPLETE  Result Date: 05/05/2020    ECHOCARDIOGRAM REPORT   Patient Name:   [facility] FAUCETTE Date of Exam: 05/05/2020 Medical Rec #:  [id number]               Height:       62.0 in Accession #:    [id number]              Weight:       133.9 lb Date of Birth:  06-03-1966              BSA:          1.612 m Patient Age:    54 years                BP:           108/64 mmHg Patient Gender: F                       HR:           115 bpm. Exam Location:  [facility] Procedure: 2D Echo, Color Doppler and Cardiac Doppler Indications:     Respiratory failure with hypoxia  History:         Patient has no prior history of Echocardiogram examinations.                  Asthma.  Sonographer:     Charmayne Sheer RDCS (AE) Referring Phys:  [place] Diagnosing Phys: Kathlyn Sacramento MD  Sonographer Comments:  Suboptimal parasternal window. IMPRESSIONS  1. Left ventricular ejection fraction, by estimation, is 60 to 65%. The left ventricle has normal function. The left ventricle has no regional wall motion abnormalities. Left ventricular diastolic parameters are consistent with Grade I diastolic dysfunction (impaired relaxation).  2.  Right ventricular systolic function is normal. The right ventricular size is normal. Tricuspid regurgitation signal is inadequate for assessing PA pressure.  3. The mitral valve is normal in structure. No evidence of mitral valve regurgitation. No evidence of mitral stenosis.  4. The aortic valve was not well visualized. Aortic valve regurgitation is not visualized. Mild aortic valve stenosis.  5. The inferior vena cava is normal in size with greater than 50% respiratory variability, suggesting right atrial pressure of 3 mmHg. FINDINGS  Left Ventricle: Left ventricular ejection fraction, by estimation, is 60 to 65%. The left ventricle has normal function. The left ventricle has no regional wall motion abnormalities. The left ventricular internal cavity size was normal in size. There is  no left ventricular hypertrophy. Left ventricular diastolic parameters are consistent with Grade I diastolic dysfunction (impaired relaxation). Right Ventricle: The right ventricular size is normal. No increase in right ventricular wall thickness. Right ventricular systolic function is normal. Tricuspid regurgitation signal is inadequate for assessing PA pressure. Left Atrium: Left atrial size was normal in size. Right Atrium: Right atrial size was normal in size. Pericardium: There is no evidence of pericardial effusion. Mitral Valve: The mitral valve is normal in structure. No evidence of mitral valve regurgitation. No evidence of mitral valve stenosis. MV peak gradient, 9.1 mmHg. The mean mitral valve gradient is 4.0 mmHg. Tricuspid Valve: The tricuspid valve is normal in structure. Tricuspid valve  regurgitation is not demonstrated. No evidence of tricuspid stenosis. Aortic Valve: The aortic valve was not well visualized. Aortic valve regurgitation is not visualized. Mild aortic stenosis is present. Aortic valve mean gradient measures 12.0 mmHg. Aortic valve peak gradient measures 23.4 mmHg. Aortic valve area, by VTI  measures 0.93 cm. Pulmonic Valve: The pulmonic valve was normal in structure. Pulmonic valve regurgitation is not visualized. No evidence of pulmonic stenosis. Aorta: The aortic root is normal in size and structure. Venous: The inferior vena cava is normal in size with greater than 50% respiratory variability, suggesting right atrial pressure of 3 mmHg. IAS/Shunts: No atrial level shunt detected by color flow Doppler.  LEFT VENTRICLE PLAX 2D LVIDd:         4.11 cm  Diastology LVIDs:         2.25 cm  LV e' medial:    9.79 cm/s LV PW:         0.85 cm  LV E/e' medial:  11.6 LV IVS:        0.62 cm  LV e' lateral:   18.40 cm/s LVOT diam:     1.40 cm  LV E/e' lateral: 6.2 LV SV:         34 LV SV Index:   21 LVOT Area:     1.54 cm  RIGHT VENTRICLE RV Basal diam:  2.53 cm LEFT ATRIUM             Index       RIGHT ATRIUM          Index LA diam:        2.70 cm 1.67 cm/m  RA Area:     9.09 cm LA Vol (A2C):   41.1 ml 25.49 ml/m RA Volume:   17.60 ml 10.92 ml/m LA Vol (A4C):   34.2 ml 21.21 ml/m LA Biplane Vol: 38.5 ml 23.88 ml/m  AORTIC VALVE                    PULMONIC VALVE AV Area (Vmax):    0.85 cm  PV Vmax:       1.38 m/s AV Area (Vmean):   0.92 cm     PV Vmean:      88.200 cm/s AV Area (VTI):     0.93 cm     PV VTI:        0.198 m AV Vmax:           242.00 cm/s  PV Peak grad:  7.6 mmHg AV Vmean:          159.000 cm/s PV Mean grad:  4.0 mmHg AV VTI:            0.369 m AV Peak Grad:      23.4 mmHg AV Mean Grad:      12.0 mmHg LVOT Vmax:         134.00 cm/s LVOT Vmean:        94.900 cm/s LVOT VTI:          0.223 m LVOT/AV VTI ratio: 0.60  AORTA Ao Root diam: 2.30 cm MITRAL VALVE MV Area  (PHT): 8.07 cm     SHUNTS MV Peak grad:  9.1 mmHg     Systemic VTI:  0.22 m MV Mean grad:  4.0 mmHg     Systemic Diam: 1.40 cm MV Vmax:       1.51 m/s MV Vmean:      97.4 cm/s MV Decel Time: 94 msec MV E velocity: 114.00 cm/s MV A velocity: 131.00 cm/s MV E/A ratio:  0.87 Kathlyn Sacramento MD Electronically signed by Kathlyn Sacramento MD Signature Date/Time: 05/05/2020/5:14:53 PM    Final     Microbiology: Recent Results (from the past 240 hour(s))  Respiratory Panel by RT PCR (Flu A&B, Covid) - Nasopharyngeal Swab     Status: None   Collection Time: 05/04/20  4:53 PM   Specimen: Nasopharyngeal Swab  Result Value Ref Range Status   SARS Coronavirus 2 by RT PCR NEGATIVE NEGATIVE Final    Comment: (NOTE) SARS-CoV-2 target nucleic acids are NOT DETECTED.  The SARS-CoV-2 RNA is generally detectable in upper respiratoy specimens during the acute phase of infection. The lowest concentration of SARS-CoV-2 viral copies this assay can detect is 131 copies/mL. A negative result does not preclude SARS-Cov-2 infection and should not be used as the sole basis for treatment or other patient management decisions. A negative result may occur with  improper specimen collection/handling, submission of specimen other than nasopharyngeal swab, presence of viral mutation(s) within the areas targeted by this assay, and inadequate number of viral copies (<131 copies/mL). A negative result must be combined with clinical observations, patient history, and epidemiological information. The expected result is Negative.  Fact Sheet for Patients:  PinkCheek.be  Fact Sheet for Healthcare Providers:  GravelBags.it  This test is no t yet approved or cleared by the [place] FDA and  has been authorized for detection and/or diagnosis of SARS-CoV-2 by FDA under an Emergency Use Authorization (EUA). This EUA will remain  in effect (meaning this test can be used)  for the duration of the COVID-19 declaration under Section 564(b)(1) of the Act, 21 U.S.C. section 360bbb-3(b)(1), unless the authorization is terminated or revoked sooner.     Influenza A by PCR NEGATIVE NEGATIVE Final   Influenza B by PCR NEGATIVE NEGATIVE Final    Comment: (NOTE) The Xpert Xpress SARS-CoV-2/FLU/RSV assay is intended as an aid in  the diagnosis of influenza from Nasopharyngeal swab specimens and  should not be used as a sole basis for  treatment. Nasal washings and  aspirates are unacceptable for Xpert Xpress SARS-CoV-2/FLU/RSV  testing.  Fact Sheet for Patients: PinkCheek.be  Fact Sheet for Healthcare Providers: GravelBags.it  This test is not yet approved or cleared by the [place] FDA and  has been authorized for detection and/or diagnosis of SARS-CoV-2 by  FDA under an Emergency Use Authorization (EUA). This EUA will remain  in effect (meaning this test can be used) for the duration of the  Covid-19 declaration under Section 564(b)(1) of the Act, 21  U.S.C. section 360bbb-3(b)(1), unless the authorization is  terminated or revoked. Performed at [facility], [address] [place], [place] [address]      Labs: Basic Metabolic Panel: Recent Labs  Lab 05/04/20 1653  NA 140  K 3.1*  CL 101  CO2 28  GLUCOSE 131*  BUN 10  CREATININE 0.68  CALCIUM 8.7*   Liver Function Tests: No results for input(s): AST, ALT, ALKPHOS, BILITOT, PROT, ALBUMIN in the last 168 hours. No results for input(s): LIPASE, AMYLASE in the last 168 hours. No results for input(s): AMMONIA in the last 168 hours. CBC: Recent Labs  Lab 05/04/20 1653  WBC 4.6  HGB 13.7  HCT 42.1  MCV 92.1  PLT 288   Cardiac Enzymes: No results for input(s): CKTOTAL, CKMB, CKMBINDEX, TROPONINI in the last 168 hours. BNP: BNP (last 3 results) Recent Labs    05/04/20 1845  BNP 16.1    ProBNP (last 3 results) No  results for input(s): PROBNP in the last 8760 hours.  CBG: No results for input(s): GLUCAP in the last 168 hours.     Signed:  Alma Friendly, MD [facility] 05/05/2020, 5:49 PM

## 2020-05-05 NOTE — Assessment & Plan Note (Signed)
Pt admitted for sob related to suspected asthma from nsaids allergy. Pt continued on steroids and prn albuterol.  D/d also include gerd variant asthma and so will get iv ppi. Pt advised for allergy testing as outpatient.

## 2020-05-22 LAB — BLOOD GAS, VENOUS
Acid-Base Excess: 0 mmol/L (ref 0.0–2.0)
Bicarbonate: 26.4 mmol/L (ref 20.0–28.0)
O2 Saturation: 75.3 %
Patient temperature: 37
pCO2, Ven: 49 mmHg (ref 44.0–60.0)
pH, Ven: 7.34 (ref 7.250–7.430)
pO2, Ven: 43 mmHg (ref 32.0–45.0)

## 2020-12-09 ENCOUNTER — Encounter (HOSPITAL_BASED_OUTPATIENT_CLINIC_OR_DEPARTMENT_OTHER): Payer: Self-pay | Admitting: *Deleted

## 2020-12-09 ENCOUNTER — Emergency Department (HOSPITAL_BASED_OUTPATIENT_CLINIC_OR_DEPARTMENT_OTHER)
Admission: EM | Admit: 2020-12-09 | Discharge: 2020-12-09 | Disposition: A | Discharge status: Home / Self Care | Payer: BC Managed Care – PPO | Attending: Emergency Medicine | Admitting: Emergency Medicine

## 2020-12-09 ENCOUNTER — Other Ambulatory Visit: Payer: Self-pay

## 2020-12-09 DIAGNOSIS — H9202 Otalgia, left ear: Secondary | ICD-10-CM | POA: Insufficient documentation

## 2020-12-09 DIAGNOSIS — J0100 Acute maxillary sinusitis, unspecified: Secondary | ICD-10-CM

## 2020-12-09 MED ORDER — AMOXICILLIN-POT CLAVULANATE 875-125 MG PO TABS: 1.0000 | ORAL_TABLET | Freq: Two times a day (BID) | ORAL | 0 refills | Status: DC

## 2020-12-09 NOTE — ED Notes (Signed)
PT refused COVID swab at this time. Pt states that she did a at home covid test on yesterday and it was negative. Per the patient, "I know my body and I don't have that. I have allergies and maybe a ear infection".  Dr made aware at this time.

## 2020-12-09 NOTE — ED Provider Notes (Signed)
[facility] EMERGENCY DEPARTMENT Provider Note   CSN: [id number] Arrival date & time: 12/09/20  1742     History Chief Complaint  Patient presents with   Cough    Phyllis Collins is a 55 y.o. female with no pertinent past medical history that presents to the emerge department today for URI symptoms.  Patient presents with sinus pain, congestion, headache and slight cough which is productive over the past 8 days, states it is worsened over the last 3 days.  Patient denies any fever.  Primary concerns are the nasal congestion and the facial pain.  Patient states that she has a history of multiple sinus infections, last one was in October.  Denies any immunocompromise state.  Denies any myalgias, diarrhea, sore throat.  Does admit to postnasal drip.  Denies any vomiting, has been eating and drinking normally.  States that she is having to take her albuterol inhaler for her asthma more often.  Denies any shortness of breath or chest pain.  No difficulty breathing.  Denies any sick contacts.  States that she took a COVID test 2 days ago which were negative.  Patient states that her allergies have also been already severe recently.  HPI     History reviewed. No pertinent past medical history.  Patient Active Problem List   Diagnosis Date Noted   Respiratory failure with hypoxia ([place]) 05/04/2020   Right shoulder pain 04/09/2011    Past Surgical History:  Procedure Laterality Date   CESAREAN SECTION     FOOT SURGERY       OB History   No obstetric history on file.     Family History  Problem Relation Age of Onset   Sudden death Father    Heart attack Father    Hypertension Paternal Grandmother    High Cholesterol Mother    Hypertension Mother    Diabetes Neg Hx    Hyperlipidemia Neg Hx     Social History   Tobacco Use   Smoking status: Never   Smokeless tobacco: Never  Vaping Use   Vaping Use: Never used  Substance Use Topics   Alcohol use: Yes     Comment: occ   Drug use: No    Home Medications Prior to Admission medications   Medication Sig Start Date End Date Taking? Authorizing Provider  amoxicillin-clavulanate (AUGMENTIN) 875-125 MG tablet Take 1 tablet by mouth every 12 (twelve) hours. 12/09/20  Yes Alfredia Client, PA-C  albuterol (VENTOLIN HFA) 108 (90 Base) MCG/ACT inhaler Inhale 2 puffs into the lungs every 4 (four) hours as needed for shortness of breath or wheezing. 05/01/20   [provider]  fluticasone (FLONASE) 50 MCG/ACT nasal spray Place 1-2 sprays into both nostrils daily. 05/05/20   Alma Friendly, MD  Multiple Vitamin (MULTIVITAMIN) LIQD Take 5 mLs by mouth daily.    [provider]  pantoprazole (PROTONIX) 40 MG tablet Take 1 tablet (40 mg total) by mouth daily. 05/05/20 06/04/20  Alma Friendly, MD  TRELEGY ELLIPTA 100-62.5-25 MCG/INH AEPB Inhale 1 puff into the lungs daily. 04/22/20   [provider]    Allergies    Nitrofurantoin monohyd macro  Review of Systems   Review of Systems  Constitutional:  Negative for chills, diaphoresis, fatigue and fever.  HENT:  Positive for congestion, ear pain, sinus pressure and sinus pain. Negative for facial swelling, hearing loss, mouth sores, nosebleeds, sneezing, sore throat, tinnitus, trouble swallowing and voice change.   Eyes:  Negative for  pain and visual disturbance.  Respiratory:  Positive for cough. Negative for shortness of breath and wheezing.   Cardiovascular:  Negative for chest pain, palpitations and leg swelling.  Gastrointestinal:  Negative for abdominal distention, abdominal pain, diarrhea, nausea and vomiting.  Genitourinary:  Negative for difficulty urinating.  Musculoskeletal:  Negative for back pain, neck pain and neck stiffness.  Skin:  Negative for pallor.  Neurological:  Negative for dizziness, speech difficulty, weakness and headaches.  Psychiatric/Behavioral:  Negative for confusion.    Physical  Exam Updated Vital Signs BP 111/69 (BP Location: Right Arm)   Pulse 84   Temp 98.4 F (36.9 C) (Oral)   Resp 18   Ht 5\' 2"  (1.575 m)   Wt 65.8 kg   LMP 11/25/2020   SpO2 100%   BMI 26.52 kg/m   Physical Exam Constitutional:      General: She is not in acute distress.    Appearance: Normal appearance. She is not ill-appearing, toxic-appearing or diaphoretic.  HENT:     Right Ear: Tympanic membrane and ear canal normal.     Left Ear: Ear canal normal. No drainage, swelling or tenderness. A middle ear effusion is present. Tympanic membrane is not injected.     Ears:     Comments: Left ear with some fullness in the ear.  TM pink.  No erythema.  No tenderness or swelling.  No drainage. Right ear normal.    Mouth/Throat:     Mouth: Mucous membranes are moist.     Pharynx: Oropharynx is clear.  Eyes:     General: No scleral icterus.    Extraocular Movements: Extraocular movements intact.     Pupils: Pupils are equal, round, and reactive to light.  Cardiovascular:     Rate and Rhythm: Normal rate and regular rhythm.     Pulses: Normal pulses.     Heart sounds: Normal heart sounds.  Pulmonary:     Effort: Pulmonary effort is normal. No respiratory distress.     Breath sounds: Normal breath sounds. No stridor. No wheezing, rhonchi or rales.  Chest:     Chest wall: No tenderness.  Abdominal:     General: Abdomen is flat. There is no distension.     Palpations: Abdomen is soft.     Tenderness: There is no abdominal tenderness. There is no guarding or rebound.  Musculoskeletal:        General: No swelling or tenderness. Normal range of motion.     Cervical back: Normal range of motion and neck supple. No rigidity.     Right lower leg: No edema.     Left lower leg: No edema.  Skin:    General: Skin is warm and dry.     Capillary Refill: Capillary refill takes less than 2 seconds.     Coloration: Skin is not pale.  Neurological:     General: No focal deficit present.      Mental Status: She is alert and oriented to person, place, and time.  Psychiatric:        Mood and Affect: Mood normal.        Behavior: Behavior normal.    ED Results / Procedures / Treatments   Labs (all labs ordered are listed, but only abnormal results are displayed) Labs Reviewed - No data to display  EKG None  Radiology No results found.  Procedures Procedures   Medications Ordered in ED Medications - No data to display  ED Course  I have reviewed  the triage vital signs and the nursing notes.  Pertinent labs & imaging results that were available during my care of the patient were reviewed by me and considered in my medical decision making (see chart for details).    MDM Rules/Calculators/A&P                          Patient presents to the emerge department today for URI symptoms, worsening over the last 3 days.  Patient states that she is had symptoms for about 8 days now.  Requesting antibiotics for sinus infection, shared decision making about this since I discussed that this most likely could be viral, however patient states that she knows her body and she wants to be treated for sinus infection.  Is reasonable since patient has had 8 days of symptoms, worsening over the last 3 days.  Will prescribe antibiotics at this time and have patient follow-up with PCP.  Patient is nontoxic-appearing, vital signs are stable and patient appears well.  She rejected COVID and flu test.  Doubt need for further emergent work up at this time. I explained the diagnosis and have given explicit precautions to return to the ER including for any other new or worsening symptoms. The patient understands and accepts the medical plan as it's been dictated and I have answered their questions. Discharge instructions concerning home care and prescriptions have been given. The patient is STABLE and is discharged to home in good condition.  Final Clinical Impression(s) / ED Diagnoses Final  diagnoses:  Acute non-recurrent maxillary sinusitis    Rx / DC Orders ED Discharge Orders          Ordered    amoxicillin-clavulanate (AUGMENTIN) 875-125 MG tablet  Every 12 hours        12/09/20 2231             Alfredia Client, PA-C 12/09/20 2237    Lucrezia Starch, MD 12/11/20 [phone]

## 2020-12-09 NOTE — ED Triage Notes (Signed)
C/o  pro cough , congestion , nasal congestion x 3 days , h/a .

## 2020-12-09 NOTE — ED Provider Notes (Signed)
I did not see this patient.  Patient left without being seen.   Alfredia Client, PA-C 12/09/20 2119    Lucrezia Starch, MD 12/11/20 [phone]

## 2020-12-09 NOTE — Discharge Instructions (Addendum)
  You were evaluated in the Emergency Department and after careful evaluation, we did not find any emergent condition requiring admission or further testing in the hospital.   Your exam/testing today was overall reassuring.  Symptoms seem to be due to an upper respiratory infection, as we discussed you could have a viral sinusitis however I did prescribe you antibiotics as requested.  Please follow-up with your ENT doctor or your primary care doctor. Please return to the Emergency Department if you experience any worsening of your condition.  Thank you for allowing [place] to be a part of your care. Please speak to your pharmacist about any new medications prescribed today in regards to side effects or interactions with other medications.

## 2021-01-11 ENCOUNTER — Emergency Department (HOSPITAL_BASED_OUTPATIENT_CLINIC_OR_DEPARTMENT_OTHER): Payer: Self-pay

## 2021-01-11 ENCOUNTER — Encounter (HOSPITAL_BASED_OUTPATIENT_CLINIC_OR_DEPARTMENT_OTHER): Payer: Self-pay

## 2021-01-11 ENCOUNTER — Other Ambulatory Visit: Payer: Self-pay

## 2021-01-11 ENCOUNTER — Emergency Department (HOSPITAL_BASED_OUTPATIENT_CLINIC_OR_DEPARTMENT_OTHER)
Admission: EM | Admit: 2021-01-11 | Discharge: 2021-01-11 | Disposition: A | Discharge status: Home / Self Care | Payer: Self-pay | Attending: Emergency Medicine | Admitting: Emergency Medicine

## 2021-01-11 DIAGNOSIS — Z20822 Contact with and (suspected) exposure to covid-19: Secondary | ICD-10-CM | POA: Insufficient documentation

## 2021-01-11 DIAGNOSIS — E876 Hypokalemia: Secondary | ICD-10-CM | POA: Insufficient documentation

## 2021-01-11 DIAGNOSIS — D72819 Decreased white blood cell count, unspecified: Secondary | ICD-10-CM | POA: Insufficient documentation

## 2021-01-11 DIAGNOSIS — R0602 Shortness of breath: Secondary | ICD-10-CM

## 2021-01-11 DIAGNOSIS — J4541 Moderate persistent asthma with (acute) exacerbation: Secondary | ICD-10-CM | POA: Insufficient documentation

## 2021-01-11 HISTORY — DX: Unspecified asthma, uncomplicated: J45.909

## 2021-01-11 LAB — CBC WITH DIFFERENTIAL/PLATELET
Abs Immature Granulocytes: 0.01 10*3/uL (ref 0.00–0.07)
Basophils Absolute: 0 10*3/uL (ref 0.0–0.1)
Basophils Relative: 1 %
Eosinophils Absolute: 0.2 10*3/uL (ref 0.0–0.5)
Eosinophils Relative: 7 %
HCT: 41.8 % (ref 36.0–46.0)
Hemoglobin: 14.1 g/dL (ref 12.0–15.0)
Immature Granulocytes: 0 %
Lymphocytes Relative: 36 %
Lymphs Abs: 1.2 10*3/uL (ref 0.7–4.0)
MCH: 30.2 pg (ref 26.0–34.0)
MCHC: 33.7 g/dL (ref 30.0–36.0)
MCV: 89.5 fL (ref 80.0–100.0)
Monocytes Absolute: 0.3 10*3/uL (ref 0.1–1.0)
Monocytes Relative: 10 %
Neutro Abs: 1.6 10*3/uL — ABNORMAL LOW (ref 1.7–7.7)
Neutrophils Relative %: 46 %
Platelets: 223 10*3/uL (ref 150–400)
RBC: 4.67 MIL/uL (ref 3.87–5.11)
RDW: 11.9 % (ref 11.5–15.5)
WBC: 3.5 10*3/uL — ABNORMAL LOW (ref 4.0–10.5)
nRBC: 0 % (ref 0.0–0.2)

## 2021-01-11 LAB — BASIC METABOLIC PANEL
Anion gap: 7 (ref 5–15)
BUN: 10 mg/dL (ref 6–20)
CO2: 26 mmol/L (ref 22–32)
Calcium: 8.9 mg/dL (ref 8.9–10.3)
Chloride: 102 mmol/L (ref 98–111)
Creatinine, Ser: 0.89 mg/dL (ref 0.44–1.00)
GFR, Estimated: >60 mL/min (ref >60)
Glucose, Bld: 92 mg/dL (ref 70–99)
Potassium: 3.4 mmol/L — ABNORMAL LOW (ref 3.5–5.1)
Sodium: 135 mmol/L (ref 135–145)

## 2021-01-11 LAB — TROPONIN I (HIGH SENSITIVITY): Troponin I (High Sensitivity): <2 ng/L (ref <18)

## 2021-01-11 LAB — RESP PANEL BY RT-PCR (FLU A&B, COVID) ARPGX2
Influenza A by PCR: NEGATIVE
Influenza B by PCR: NEGATIVE
SARS Coronavirus 2 by RT PCR: NEGATIVE

## 2021-01-11 LAB — PREGNANCY, URINE: Preg Test, Ur: NEGATIVE

## 2021-01-11 MED ORDER — IPRATROPIUM-ALBUTEROL 0.5-2.5 (3) MG/3ML IN SOLN
3.0000 mL | Freq: Once | RESPIRATORY_TRACT | Status: AC
  Administered 2021-01-11: 3 mL via RESPIRATORY_TRACT
  Filled 2021-01-11: qty 3

## 2021-01-11 MED ORDER — METHYLPREDNISOLONE SODIUM SUCC 125 MG IJ SOLR
125.0000 mg | Freq: Once | INTRAMUSCULAR | Status: AC
  Administered 2021-01-11: 125 mg via INTRAVENOUS
  Filled 2021-01-11: qty 2

## 2021-01-11 MED ORDER — ALBUTEROL SULFATE HFA 108 (90 BASE) MCG/ACT IN AERS
2.0000 | INHALATION_SPRAY | Freq: Once | RESPIRATORY_TRACT | Status: AC
  Administered 2021-01-11: 2 via RESPIRATORY_TRACT
  Filled 2021-01-11: qty 6.7

## 2021-01-11 MED ORDER — PREDNISONE 50 MG PO TABS: 50.0000 mg | ORAL_TABLET | Freq: Every day | ORAL | 0 refills | Status: AC

## 2021-01-11 NOTE — Discharge Instructions (Addendum)
You were seen in the ER today for your asthma exacerbation.  Your physical exam and vital signs are reassuring as was your chest x-ray.  Your symptoms significantly proved after 2 breathing treatments and a dose of IV steroids. Prescribed steroids to take daily for the next 5 days.  Please take them as prescribed for entire course.  Return to the ER if you develop any new severe symptoms.

## 2021-01-11 NOTE — ED Provider Notes (Signed)
[facility] EMERGENCY DEPARTMENT Provider Note   CSN: [id number] Arrival date & time: 01/11/21  1114     History Chief Complaint  Patient presents with   Shortness of Breath    Phyllis Collins is a 55 y.o. female with history of asthma who presents with concern for chest tightness and shortness of breath that started this morning.  States that when she typically has shortness of breath it is improved with her daily Trelegy inhaler, however it did not improve with that today and she is out of her as needed albuterol at home.  States that she presented to the ER due to worsening shortness of breath and central chest pain and pressure that does not radiate.  Denies any palpitations.  Does endorse chronic sinus issues for which she follows with ENT, at her baseline.  I personally read this patient's medical records.  She does have history of asthma and history of respiratory failure with hypoxia which required hospitalization but no intubation.  She is vaccinated x2 against COVID-14 and works as an Corporate treasurer in the emergency department.  HPI     Past Medical History:  Diagnosis Date   Asthma     Patient Active Problem List   Diagnosis Date Noted   Respiratory failure with hypoxia ([place]) 05/04/2020   Right shoulder pain 04/09/2011    Past Surgical History:  Procedure Laterality Date   CESAREAN SECTION     FOOT SURGERY       OB History   No obstetric history on file.     Family History  Problem Relation Age of Onset   Sudden death Father    Heart attack Father    Hypertension Paternal Grandmother    High Cholesterol Mother    Hypertension Mother    Diabetes Neg Hx    Hyperlipidemia Neg Hx     Social History   Tobacco Use   Smoking status: Never   Smokeless tobacco: Never  Vaping Use   Vaping Use: Never used  Substance Use Topics   Alcohol use: Not Currently    Comment: occ   Drug use: No    Home Medications Prior to Admission medications    Medication Sig Start Date End Date Taking? Authorizing Provider  predniSONE (DELTASONE) 50 MG tablet Take 1 tablet (50 mg total) by mouth daily with breakfast for 5 days. 01/11/21 01/16/21 Yes Obert Espindola R, PA-C  albuterol (VENTOLIN HFA) 108 (90 Base) MCG/ACT inhaler Inhale 2 puffs into the lungs every 4 (four) hours as needed for shortness of breath or wheezing. 05/01/20   [provider]  amoxicillin-clavulanate (AUGMENTIN) 875-125 MG tablet Take 1 tablet by mouth every 12 (twelve) hours. 12/09/20   Alfredia Client, PA-C  fluticasone (FLONASE) 50 MCG/ACT nasal spray Place 1-2 sprays into both nostrils daily. 05/05/20   Alma Friendly, MD  Multiple Vitamin (MULTIVITAMIN) LIQD Take 5 mLs by mouth daily.    [provider]  pantoprazole (PROTONIX) 40 MG tablet Take 1 tablet (40 mg total) by mouth daily. 05/05/20 06/04/20  Alma Friendly, MD  TRELEGY ELLIPTA 100-62.5-25 MCG/INH AEPB Inhale 1 puff into the lungs daily. 04/22/20   [provider]    Allergies    Hydrocodone, Ibuprofen, Nsaids, Nitrofurantoin monohyd macro, and Ciprofloxacin  Review of Systems   Review of Systems  Constitutional: Negative.   HENT:  Positive for sinus pressure and sinus pain.   Eyes: Negative.   Respiratory:  Positive for chest tightness, shortness of breath  and wheezing.   Cardiovascular:  Positive for chest pain. Negative for palpitations and leg swelling.  Gastrointestinal: Negative.   Genitourinary: Negative.   Musculoskeletal: Negative.   Neurological: Negative.    Physical Exam Updated Vital Signs BP 116/77   Pulse (!) 102 Comment: after ambulating  Temp 98 F (36.7 C) (Oral)   Resp 14 Comment: after ambulating  Ht '5\' 2"'$  (1.575 m)   Wt 64.9 kg   LMP 12/27/2020 (Approximate)   SpO2 100% Comment: after ambulating  BMI 26.16 kg/m   Physical Exam Vitals and nursing note reviewed.  Constitutional:      Appearance: She is overweight. She is not  toxic-appearing.  HENT:     Head: Normocephalic and atraumatic.     Nose: Nose normal.     Mouth/Throat:     Mouth: Mucous membranes are moist.     Pharynx: Oropharynx is clear. Uvula midline. No oropharyngeal exudate, posterior oropharyngeal erythema or uvula swelling.     Tonsils: No tonsillar exudate.  Eyes:     General: Lids are normal. Vision grossly intact.        Right eye: No discharge.        Left eye: No discharge.     Extraocular Movements: Extraocular movements intact.     Conjunctiva/sclera: Conjunctivae normal.     Pupils: Pupils are equal, round, and reactive to light.  Neck:     Trachea: Phonation normal.  Cardiovascular:     Rate and Rhythm: Normal rate and regular rhythm.     Pulses: Normal pulses.     Heart sounds: Normal heart sounds. No murmur heard. Pulmonary:     Effort: Pulmonary effort is normal. Tachypnea and prolonged expiration present. No bradypnea, accessory muscle usage or respiratory distress.     Breath sounds: Decreased air movement present. Examination of the right-lower field reveals wheezing. Examination of the left-lower field reveals wheezing. Wheezing present. No rales.  Chest:     Chest wall: No mass, lacerations, deformity, swelling, tenderness, crepitus or edema.  Abdominal:     General: Bowel sounds are normal. There is no distension.     Palpations: Abdomen is soft.     Tenderness: There is no abdominal tenderness. There is no right CVA tenderness, left CVA tenderness, guarding or rebound.  Musculoskeletal:        General: No deformity.     Cervical back: Normal range of motion and neck supple. No rigidity or crepitus. No pain with movement, spinous process tenderness or muscular tenderness.     Right lower leg: No edema.     Left lower leg: No edema.  Lymphadenopathy:     Cervical: No cervical adenopathy.  Skin:    General: Skin is warm and dry.     Capillary Refill: Capillary refill takes less than 2 seconds.  Neurological:      General: No focal deficit present.     Mental Status: She is alert and oriented to person, place, and time. Mental status is at baseline.  Psychiatric:        Mood and Affect: Mood normal.    ED Results / Procedures / Treatments   Labs (all labs ordered are listed, but only abnormal results are displayed) Labs Reviewed  CBC WITH DIFFERENTIAL/PLATELET - Abnormal; Notable for the following components:      Result Value   WBC 3.5 (*)    Neutro Abs 1.6 (*)    All other components within normal limits  BASIC METABOLIC PANEL - Abnormal;  Notable for the following components:   Potassium 3.4 (*)    All other components within normal limits  RESP PANEL BY RT-PCR (FLU A&B, COVID) ARPGX2  PREGNANCY, URINE  TROPONIN I (HIGH SENSITIVITY)    EKG EKG Interpretation  Date/Time:  'Sunday January 11 2021 11:26:47 EDT Ventricular Rate:  92 PR Interval:  152 QRS Duration: 70 QT Interval:  334 QTC Calculation: 413 R Axis:   79 Text Interpretation: Normal sinus rhythm Septal infarct , age undetermined Abnormal ECG No significant change since last tracing Confirmed by Butler, Michael ([id number]) on 01/11/2021 11:28:16 AM  Radiology DG Chest Port 1 View  Result Date: 01/11/2021 CLINICAL DATA:  Shortness of breath, chest tightness, bilateral wheezing noted, onset this am EXAM: PORTABLE CHEST 1 VIEW COMPARISON:  May 04, 2020 FINDINGS: The cardiomediastinal silhouette is normal in contour. No pleural effusion. No pneumothorax. No acute pleuroparenchymal abnormality. Visualized abdomen is unremarkable. No acute osseous abnormality noted. IMPRESSION: No acute cardiopulmonary abnormality. Electronically Signed   By: Stephanie  Peacock MD   On: 01/11/2021 12:12    Procedures Procedures   Medications Ordered in ED Medications  albuterol (VENTOLIN HFA) 108 (90 Base) MCG/ACT inhaler 2 puff (2 puffs Inhalation Given by Other 01/11/21 1128)  ipratropium-albuterol (DUONEB) 0.5-2.5 (3) MG/3ML nebulizer solution 3  mL (3 mLs Nebulization Given 01/11/21 1234)  methylPREDNISolone sodium succinate (SOLU-MEDROL) 125 mg/2 mL injection 125 mg (125 mg Intravenous Given 01/11/21 1257)  ipratropium-albuterol (DUONEB) 0.5-2.5 (3) MG/3ML nebulizer solution 3 mL (3 mLs Nebulization Given 01/11/21 1410)    ED Course  I have reviewed the triage vital signs and the nursing notes.  Pertinent labs & imaging results that were available during my care of the patient were reviewed by me and considered in my medical decision making (see chart for details).  Clinical Course as of 01/11/21 1709  Sun Jan 11, 2021  1350 Patient reevaluated with resolution of her wheezing but still significantly diminished breath sounds in the bases and peak flow of 180.  We will repeat DuoNeb.  Labs reassuring. [RS]    Clinical Course User Index [RS] ,  R, PA-C   MDM Rules/Calculators/A&P                         55'$  year old female presents with concern for shortness of breath and chest tightness that started this morning.  History of asthma.  Differential diagnosis includes not limited to reactive airway disease including asthma, pneumonia, ACS, angioedema, anemia.  Tachypneic on intake, vital signs otherwise normal.  Cardiac exam is normal, pulmonary exam with tachypnea, diminished breath sounds, and wheezing in the bases bilaterally.  Abdominal exam is benign.  Patient did receive 2 puffs of albuterol in triage with some improvement but she remains tight on exam.  We will proceed with DuoNeb basic laboratory studies.  CBC with leukopenia of 3.5, BMP with mild hypokalemia of 3.4.   Troponin negative, less than 2.  EKG is reassuring, normal sinus rhythm no STEMI.  And chest x-ray also was reassuring without acute cardiopulmonary abnormality.  Will reevaluate after DuoNeb.  Patient reevaluated after 2 DuoNeb's with maintenance of her O2 sat and improvement in her chest tightness.  Has been administered dose of IV steroid.  No  further work-up warranted in the this time.  Ambulated with maintenance of her O2 on room air.  Will discharge with steroid burst.  Phyllis Collins voiced understanding of her medical evaluation and treatment plan.  Each of her questions  was answered to her expressed satisfaction.  Return precautions given.  Patient is well-appearing, stable, appropriate for discharge this time.  This chart was dictated using voice recognition software, Dragon. Despite the best efforts of this provider to proofread and correct errors, errors may still occur which can change documentation meaning.  Final Clinical Impression(s) / ED Diagnoses Final diagnoses:  Moderate persistent asthma with exacerbation    Rx / DC Orders ED Discharge Orders          Ordered    predniSONE (DELTASONE) 50 MG tablet  Daily with breakfast        01/11/21 1615             Joeph Szatkowski, Gypsy Balsam, PA-C 01/11/21 1709    Hayden Rasmussen, MD 01/12/21 1150

## 2021-01-11 NOTE — ED Notes (Signed)
Placed on cont cardiac monitoring secondary to cont c/o chest tightness

## 2021-01-11 NOTE — ED Notes (Signed)
Pt did well ambulating sats100%, provider aware

## 2021-01-11 NOTE — ED Notes (Signed)
Speech WNL, alert and oriented, color WNL

## 2021-01-11 NOTE — ED Notes (Signed)
Ambulated on r/a Spo2 98-100%, denies DOE, HR 110-116mx. rr22.

## 2021-01-11 NOTE — ED Notes (Signed)
Ambulated to exam room, gait very steady, no resp distress was noted, per RT inhaler was administered. Placed on cont POX monitoring in exam room with freq NBP assessments.

## 2021-01-11 NOTE — ED Triage Notes (Signed)
Pt has SOB & chest pain since this morning. Hx of asthma. RT in triage to assess. C/o headache, cough & congestion.

## 2021-04-23 ENCOUNTER — Other Ambulatory Visit: Payer: Self-pay

## 2021-04-23 ENCOUNTER — Emergency Department (HOSPITAL_BASED_OUTPATIENT_CLINIC_OR_DEPARTMENT_OTHER): Payer: Medicaid Other

## 2021-04-23 ENCOUNTER — Emergency Department (HOSPITAL_BASED_OUTPATIENT_CLINIC_OR_DEPARTMENT_OTHER)
Admission: EM | Admit: 2021-04-23 | Discharge: 2021-04-23 | Disposition: A | Discharge status: Home / Self Care | Payer: Medicaid Other | Attending: Emergency Medicine | Admitting: Emergency Medicine

## 2021-04-23 ENCOUNTER — Encounter (HOSPITAL_BASED_OUTPATIENT_CLINIC_OR_DEPARTMENT_OTHER): Payer: Self-pay

## 2021-04-23 DIAGNOSIS — J45909 Unspecified asthma, uncomplicated: Secondary | ICD-10-CM | POA: Insufficient documentation

## 2021-04-23 DIAGNOSIS — R000 Tachycardia, unspecified: Secondary | ICD-10-CM | POA: Insufficient documentation

## 2021-04-23 DIAGNOSIS — J101 Influenza due to other identified influenza virus with other respiratory manifestations: Secondary | ICD-10-CM | POA: Insufficient documentation

## 2021-04-23 DIAGNOSIS — Z7951 Long term (current) use of inhaled steroids: Secondary | ICD-10-CM | POA: Insufficient documentation

## 2021-04-23 DIAGNOSIS — Z20822 Contact with and (suspected) exposure to covid-19: Secondary | ICD-10-CM | POA: Insufficient documentation

## 2021-04-23 LAB — RESP PANEL BY RT-PCR (FLU A&B, COVID) ARPGX2
Influenza A by PCR: POSITIVE — AB
Influenza B by PCR: NEGATIVE
SARS Coronavirus 2 by RT PCR: NEGATIVE

## 2021-04-23 MED ORDER — BENZONATATE 100 MG PO CAPS: 100.0000 mg | ORAL_CAPSULE | Freq: Three times a day (TID) | ORAL | 0 refills | Status: DC

## 2021-04-23 MED ORDER — ALBUTEROL SULFATE HFA 108 (90 BASE) MCG/ACT IN AERS
INHALATION_SPRAY | RESPIRATORY_TRACT | Status: AC
  Administered 2021-04-23: 4
  Filled 2021-04-23: qty 6.7

## 2021-04-23 MED ORDER — BENZONATATE 100 MG PO CAPS
200.0000 mg | ORAL_CAPSULE | Freq: Once | ORAL | Status: AC
  Administered 2021-04-23: 200 mg via ORAL
  Filled 2021-04-23: qty 2

## 2021-04-23 MED ORDER — OSELTAMIVIR PHOSPHATE 75 MG PO CAPS: 75.0000 mg | ORAL_CAPSULE | Freq: Two times a day (BID) | ORAL | 0 refills | Status: DC

## 2021-04-23 NOTE — ED Triage Notes (Signed)
Pt c/o flu like sx x 2 days-NAD-steady gait

## 2021-04-23 NOTE — ED Notes (Signed)
RT assessed patient briefly in waiting room due to wait. SAT 98%, RR 18, no distress noted. Able to speak in complete sentences. Stated this started a few hours ago, denies taking any of her MDIs

## 2021-04-23 NOTE — ED Provider Notes (Signed)
[facility] EMERGENCY DEPARTMENT Provider Note   CSN: [id number] Arrival date & time: 04/23/21  1728     History Chief Complaint  Patient presents with   Cough    Phyllis Collins is a 55 y.o. female.  HPI Patient is a 55 year old female with a history of asthma who presents to the emergency department due to fatigue, cough, sore throat, fevers, body aches.  Symptoms started about 2 days ago and have been worsening.  Reports central chest pain that worsens with coughing.  No shortness of breath.  No vomiting or diarrhea.  States that she was vaccinated for the flu about 2 weeks ago.    Past Medical History:  Diagnosis Date   Asthma     Patient Active Problem List   Diagnosis Date Noted   Respiratory failure with hypoxia ([place]) 05/04/2020   Right shoulder pain 04/09/2011    Past Surgical History:  Procedure Laterality Date   CESAREAN SECTION     FOOT SURGERY       OB History   No obstetric history on file.     Family History  Problem Relation Age of Onset   Sudden death Father    Heart attack Father    Hypertension Paternal Grandmother    High Cholesterol Mother    Hypertension Mother    Diabetes Neg Hx    Hyperlipidemia Neg Hx     Social History   Tobacco Use   Smoking status: Never   Smokeless tobacco: Never  Vaping Use   Vaping Use: Never used  Substance Use Topics   Alcohol use: Not Currently   Drug use: No    Home Medications Prior to Admission medications   Medication Sig Start Date End Date Taking? Authorizing Provider  benzonatate (TESSALON) 100 MG capsule Take 1 capsule (100 mg total) by mouth every 8 (eight) hours. 04/23/21  Yes Rayna Sexton, PA-C  oseltamivir (TAMIFLU) 75 MG capsule Take 1 capsule (75 mg total) by mouth every 12 (twelve) hours. 04/23/21  Yes Rayna Sexton, PA-C  albuterol (VENTOLIN HFA) 108 (90 Base) MCG/ACT inhaler Inhale 2 puffs into the lungs every 4 (four) hours as needed for shortness of breath  or wheezing. 05/01/20   [provider]  amoxicillin-clavulanate (AUGMENTIN) 875-125 MG tablet Take 1 tablet by mouth every 12 (twelve) hours. 12/09/20   Alfredia Client, PA-C  fluticasone (FLONASE) 50 MCG/ACT nasal spray Place 1-2 sprays into both nostrils daily. 05/05/20   Alma Friendly, MD  Multiple Vitamin (MULTIVITAMIN) LIQD Take 5 mLs by mouth daily.    [provider]  pantoprazole (PROTONIX) 40 MG tablet Take 1 tablet (40 mg total) by mouth daily. 05/05/20 06/04/20  Alma Friendly, MD  TRELEGY ELLIPTA 100-62.5-25 MCG/INH AEPB Inhale 1 puff into the lungs daily. 04/22/20   [provider]    Allergies    Hydrocodone, Ibuprofen, Nsaids, Nitrofurantoin monohyd macro, and Ciprofloxacin  Review of Systems   Review of Systems  Constitutional:  Positive for activity change, chills, fatigue and fever.  HENT:  Positive for congestion and sore throat. Negative for rhinorrhea.   Respiratory:  Positive for cough. Negative for shortness of breath.   Cardiovascular:  Positive for chest pain.  Gastrointestinal:  Negative for diarrhea and vomiting.   Physical Exam Updated Vital Signs BP 107/75 (BP Location: Right Arm)   Pulse (!) 112   Temp 99.2 F (37.3 C) (Oral)   Resp 17   Ht 5\' 2"  (1.575 m)   Wt  63.5 kg   LMP 04/16/2021 (Approximate)   SpO2 99%   BMI 25.61 kg/m   Physical Exam Vitals and nursing note reviewed.  Constitutional:      General: She is not in acute distress.    Appearance: Normal appearance. She is not ill-appearing, toxic-appearing or diaphoretic.  HENT:     Head: Normocephalic and atraumatic.     Right Ear: External ear normal.     Left Ear: External ear normal.     Nose: Congestion present. No rhinorrhea.     Mouth/Throat:     Mouth: Mucous membranes are moist.     Pharynx: Oropharynx is clear. No oropharyngeal exudate or posterior oropharyngeal erythema.     Comments: Uvula midline.  No exudates or erythema noted in the  posterior oropharynx.  Speaking clearly and coherently.  No stridor. Eyes:     General: No scleral icterus.       Right eye: No discharge.        Left eye: No discharge.     Extraocular Movements: Extraocular movements intact.     Conjunctiva/sclera: Conjunctivae normal.  Cardiovascular:     Rate and Rhythm: Normal rate and regular rhythm.     Pulses: Normal pulses.     Heart sounds: Normal heart sounds. No murmur heard.   No friction rub. No gallop.  Pulmonary:     Effort: Pulmonary effort is normal. No respiratory distress.     Breath sounds: Normal breath sounds. No stridor. No wheezing, rhonchi or rales.  Abdominal:     General: Abdomen is flat.     Palpations: Abdomen is soft.     Tenderness: There is no abdominal tenderness.     Comments: Abdomen is flat, soft, and nontender.  Musculoskeletal:        General: Normal range of motion.     Cervical back: Normal range of motion and neck supple. No tenderness.  Skin:    General: Skin is warm and dry.  Neurological:     General: No focal deficit present.     Mental Status: She is alert and oriented to person, place, and time.  Psychiatric:        Mood and Affect: Mood normal.        Behavior: Behavior normal.   ED Results / Procedures / Treatments   Labs (all labs ordered are listed, but only abnormal results are displayed) Labs Reviewed  RESP PANEL BY RT-PCR (FLU A&B, COVID) ARPGX2 - Abnormal; Notable for the following components:      Result Value   Influenza A by PCR POSITIVE (*)    All other components within normal limits   EKG EKG Interpretation  Date/Time:  Thursday April 23 2021 21:21:31 EDT Ventricular Rate:  116 PR Interval:  151 QRS Duration: 74 QT Interval:  314 QTC Calculation: 437 R Axis:   80 Text Interpretation: Sinus tachycardia Probable anteroseptal infarct, old agree, no sig change from previous Confirmed by Charlesetta Shanks [phone]) on 04/23/2021 11:53:20 PM  Radiology DG Chest Portable 1  View  Result Date: 04/23/2021 CLINICAL DATA:  Chest pressure, flu like symptoms EXAM: PORTABLE CHEST 1 VIEW COMPARISON:  01/11/2021 FINDINGS: Lungs are clear.  No pleural effusion or pneumothorax. The heart is normal in size. IMPRESSION: No evidence of acute cardiopulmonary disease. Electronically Signed   By: Julian Hy M.D.   On: 04/23/2021 21:41    Procedures Procedures   Medications Ordered in ED Medications  albuterol (VENTOLIN HFA) 108 (90 Base) MCG/ACT inhaler (  4 puffs  Given 04/23/21 1808)  benzonatate (TESSALON) capsule 200 mg (200 mg Oral Given 04/23/21 2214)    ED Course  I have reviewed the triage vital signs and the nursing notes.  Pertinent labs & imaging results that were available during my care of the patient were reviewed by me and considered in my medical decision making (see chart for details).    MDM Rules/Calculators/A&P                          Pt is a 55 y.o. female who presents to the emergency department with what appears to be symptoms related to influenza A.  Labs: Respiratory panel is positive for flu A.  Imaging: Chest x-ray shows no evidence of acute cardiopulmonary disease.  I, Rayna Sexton, PA-C, personally reviewed and evaluated these images and lab results as part of my medical decision-making.  Patient with mild tachycardia.  Low-grade fever at 99.2 F.  Lungs are clear to auscultation bilaterally.  Chest x-ray is negative.  Vital signs otherwise reassuring.  No evidence of hypoxia.  Patient did note some central chest pain and tightness.  Patient does note a history of asthma.  States that her pain worsens with coughing.  I obtained a chest x-ray as well as an ECG.  As mentioned above her chest x-ray is negative.  ECG with no significant changes.  Doubt ACS at this time.  Feel that the patient is stable for discharge at this time and she is agreeable.  Given a dose of Tessalon Perles for her cough.  Will discharge on a course of  Tessalon Perles.  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  We discussed return precautions.  Her questions were answered and she was amicable at the time of discharge.  Note: Portions of this report may have been transcribed using voice recognition software. Every effort was made to ensure accuracy; however, inadvertent computerized transcription errors may be present.   Final Clinical Impression(s) / ED Diagnoses Final diagnoses:  Influenza A   Rx / DC Orders ED Discharge Orders          Ordered    oseltamivir (TAMIFLU) 75 MG capsule  Every 12 hours        04/23/21 2130    benzonatate (TESSALON) 100 MG capsule  Every 8 hours        04/23/21 2130             Rayna Sexton, PA-C 04/23/21 2357    Charlesetta Shanks, MD 04/28/21 1054

## 2021-04-23 NOTE — Progress Notes (Signed)
RT assessed further in triage. BBS slightly wheezy but good air movement. I gave her 4 puffs of an albuterol MDI. I questioned patient about home MDI and whether or not she kept one with her. She stated she had one in her bag at all times, but denies taking it today after symptoms started. Educated on importance of rescue MDI and when it is appropriate to use.

## 2021-04-23 NOTE — Discharge Instructions (Addendum)
I prescribed you 2 medications.  The first medication is called Tamiflu.  Please take this twice a day for the next 5 days.  The second medication is called Tessalon Perles.  This is a cough medication you can take up to 3 times per day.  Please come back to the emergency department with any new or worsening symptoms.  Please follow-up with your regular doctor as needed.

## 2021-04-23 NOTE — ED Notes (Signed)
RT in for assessment-was seen by RT earlier in ED WR

## 2021-10-05 ENCOUNTER — Emergency Department (HOSPITAL_BASED_OUTPATIENT_CLINIC_OR_DEPARTMENT_OTHER)
Admission: EM | Admit: 2021-10-05 | Discharge: 2021-10-05 | Disposition: A | Discharge status: Home / Self Care | Payer: PRIVATE HEALTH INSURANCE | Attending: Emergency Medicine | Admitting: Emergency Medicine

## 2021-10-05 ENCOUNTER — Emergency Department (HOSPITAL_BASED_OUTPATIENT_CLINIC_OR_DEPARTMENT_OTHER): Payer: PRIVATE HEALTH INSURANCE

## 2021-10-05 ENCOUNTER — Other Ambulatory Visit: Payer: Self-pay

## 2021-10-05 ENCOUNTER — Encounter (HOSPITAL_BASED_OUTPATIENT_CLINIC_OR_DEPARTMENT_OTHER): Payer: Self-pay | Admitting: Emergency Medicine

## 2021-10-05 DIAGNOSIS — J4541 Moderate persistent asthma with (acute) exacerbation: Secondary | ICD-10-CM

## 2021-10-05 DIAGNOSIS — R0602 Shortness of breath: Secondary | ICD-10-CM | POA: Diagnosis present

## 2021-10-05 DIAGNOSIS — Z7951 Long term (current) use of inhaled steroids: Secondary | ICD-10-CM | POA: Diagnosis not present

## 2021-10-05 MED ORDER — PREDNISONE 20 MG PO TABS
30.0000 mg | ORAL_TABLET | Freq: Once | ORAL | Status: AC
  Administered 2021-10-05: 30 mg via ORAL
  Filled 2021-10-05: qty 1

## 2021-10-05 MED ORDER — IPRATROPIUM-ALBUTEROL 0.5-2.5 (3) MG/3ML IN SOLN
3.0000 mL | Freq: Once | RESPIRATORY_TRACT | Status: AC
  Administered 2021-10-05: 3 mL via RESPIRATORY_TRACT
  Filled 2021-10-05: qty 3

## 2021-10-05 MED ORDER — PREDNISONE 20 MG PO TABS: 60.0000 mg | ORAL_TABLET | Freq: Every day | ORAL | 0 refills | Status: AC

## 2021-10-05 MED ORDER — PREDNISONE 50 MG PO TABS
60.0000 mg | ORAL_TABLET | Freq: Once | ORAL | Status: DC
  Filled 2021-10-05: qty 1

## 2021-10-05 NOTE — ED Provider Notes (Signed)
? ?[facility] EMERGENCY DEPARTMENT  ?Provider Note ? ?CSN: [id number] ?Arrival date & time: 10/05/21 0543 ? ?History ?Chief Complaint  ?Patient presents with  ?? Shortness of Breath  ? ? ?Phyllis Collins is a 56 y.o. female with history of moderate persistent asthma seen for initial consult by pulmonology at [facility] in [place] on 3/27 and started on Singulair, Trelegy, Pulmicort and albuterol MDI/nebs (does not yet have a neb machine). She is also on a prednisone taper for the last 2 weeks with only temporary improvement in her wheezing, dry cough. She has had persistnet symptoms for the last week, no fevers. Cough is not productive.  ? ? ?Home Medications ?Prior to Admission medications   ?Medication Sig Start Date End Date Taking? Authorizing Provider  ?albuterol (VENTOLIN HFA) 108 (90 Base) MCG/ACT inhaler Inhale 2 puffs into the lungs every 4 (four) hours as needed for shortness of breath or wheezing. 05/01/20  Yes [provider]  ?clindamycin (CLEOCIN) 300 MG capsule Take 300 mg by mouth 3 (three) times daily.   Yes [provider]  ?fexofenadine (ALLEGRA) 180 MG tablet Take 180 mg by mouth daily.   Yes [provider]  ?montelukast (SINGULAIR) 10 MG tablet Take 10 mg by mouth at bedtime.   Yes [provider]  ?predniSONE (DELTASONE) 10 MG tablet Take 40 mg by mouth daily. 09/23/21  Yes [provider]  ?predniSONE (DELTASONE) 20 MG tablet Take 3 tablets (60 mg total) by mouth daily for 4 days. 10/05/21 10/09/21 Yes Truddie Hidden, MD  ?amoxicillin-clavulanate (AUGMENTIN) 875-125 MG tablet Take 1 tablet by mouth every 12 (twelve) hours. 12/09/20   Alfredia Client, PA-C  ?benzonatate (TESSALON) 100 MG capsule Take 1 capsule (100 mg total) by mouth every 8 (eight) hours. 04/23/21   Rayna Sexton, PA-C  ?fluticasone (FLONASE) 50 MCG/ACT nasal spray Place 1-2 sprays into both nostrils daily. 05/05/20   Alma Friendly, MD  ?Multiple  Vitamin (MULTIVITAMIN) LIQD Take 5 mLs by mouth daily.    [provider]  ?oseltamivir (TAMIFLU) 75 MG capsule Take 1 capsule (75 mg total) by mouth every 12 (twelve) hours. 04/23/21   Rayna Sexton, PA-C  ?pantoprazole (PROTONIX) 40 MG tablet Take 1 tablet (40 mg total) by mouth daily. 05/05/20 06/04/20  Alma Friendly, MD  ?Donnal Debar 100-62.5-25 MCG/INH AEPB Inhale 1 puff into the lungs daily. 04/22/20   [provider]  ?Donnal Debar 200-62.5-25 MCG/ACT AEPB Inhale 1 puff into the lungs daily. 09/15/21   [provider]  ? ? ? ?Allergies    ?Hydrocodone, Ibuprofen, Nsaids, Nitrofurantoin monohyd macro, and Ciprofloxacin ? ? ?Review of Systems   ?Review of Systems ?Please see HPI for pertinent positives and negatives ? ?Physical Exam ?BP 106/73   Pulse (!) 105   Temp 98.4 ?F (36.9 ?C) (Oral)   Wt 63.5 kg   LMP 09/07/2021   SpO2 99%   BMI 25.60 kg/m?  ? ?Physical Exam ?Vitals and nursing note reviewed.  ?Constitutional:   ?   Appearance: Normal appearance.  ?HENT:  ?   Head: Normocephalic and atraumatic.  ?   Nose: Nose normal.  ?   Mouth/Throat:  ?   Mouth: Mucous membranes are moist.  ?Eyes:  ?   Extraocular Movements: Extraocular movements intact.  ?   Conjunctiva/sclera: Conjunctivae normal.  ?Cardiovascular:  ?   Rate and Rhythm: Normal rate.  ?Pulmonary:  ?   Effort: Pulmonary effort is normal.  ?   Breath  sounds: Decreased breath sounds and wheezing (diffusely) present.  ?Abdominal:  ?   General: Abdomen is flat.  ?   Palpations: Abdomen is soft.  ?   Tenderness: There is no abdominal tenderness.  ?Musculoskeletal:     ?   General: No swelling. Normal range of motion.  ?   Cervical back: Neck supple.  ?Skin: ?   General: Skin is warm and dry.  ?Neurological:  ?   General: No focal deficit present.  ?   Mental Status: She is alert.  ?Psychiatric:     ?   Mood and Affect: Mood normal.  ? ? ?ED Results / Procedures / Treatments    ?EKG ?None ? ?Procedures ?Procedures ? ?Medications Ordered in the ED ?Medications  ?ipratropium-albuterol (DUONEB) 0.5-2.5 (3) MG/3ML nebulizer solution 3 mL (3 mLs Nebulization Given 10/05/21 0556)  ?ipratropium-albuterol (DUONEB) 0.5-2.5 (3) MG/3ML nebulizer solution 3 mL (3 mLs Nebulization Given 10/05/21 0614)  ?predniSONE (DELTASONE) tablet 30 mg (30 mg Oral Given 10/05/21 0609)  ? ? ?Initial Impression and Plan ? Patient with exacerbation of her asthma. Already on a steroid taper but given worsening symptoms will increase back to burst dosing. She took '30mg'$  prednisone just prior to arrival. Duonebs for wheezing. She does not appear to be in distress, no hypoxia.  ? ?ED Course  ? ?Clinical Course as of 10/05/21 0702  ?Mon Oct 05, 2021  ?0630 Patient improved some after neb x 2, but still wheezing. Will continue to monitor and recheck.  [CS]  ?0653 Patient reports she is close enough to her baseline that she feels comfortably managing her symptoms at home. She has walked to the bathroom and back without difficulty. Recommend she increase her prednisone to '60mg'$  daily the next 4 days and then discuss a taper again with her Pulmonologist. RTED for any other concerns.  [CS]  ?0702 Patient has now asked to have a CXR done before going home.  [CS]  ?  ?Clinical Course User Index ?[CS] Truddie Hidden, MD  ? ? ? ?MDM Rules/Calculators/A&P ?Medical Decision Making ?Given presenting complaint, I considered that admission might be necessary. After review of results from ED lab and/or imaging studies, admission to the hospital is not indicated at this time.  ? ? ?Problems Addressed: ?Moderate persistent asthma with exacerbation: acute illness or injury ? ?Risk ?Prescription drug management. ?Decision regarding hospitalization. ? ? ? ?Final Clinical Impression(s) / ED Diagnoses ?Final diagnoses:  ?Moderate persistent asthma with exacerbation  ? ? ?Rx / DC Orders ?ED Discharge Orders   ? ?      Ordered  ?  predniSONE  (DELTASONE) 20 MG tablet  Daily       ? 10/05/21 0655  ? ?  ?  ? ?  ? ?  ?Truddie Hidden, MD ?10/05/21 [phone] ? ?

## 2021-10-05 NOTE — ED Triage Notes (Signed)
Reports increased SOB for the last week.  Using rescue inhaler at home with no relief. ?

## 2021-10-12 ENCOUNTER — Encounter (HOSPITAL_BASED_OUTPATIENT_CLINIC_OR_DEPARTMENT_OTHER): Payer: Self-pay

## 2021-10-12 ENCOUNTER — Emergency Department (HOSPITAL_BASED_OUTPATIENT_CLINIC_OR_DEPARTMENT_OTHER): Payer: PRIVATE HEALTH INSURANCE

## 2021-10-12 ENCOUNTER — Other Ambulatory Visit: Payer: Self-pay

## 2021-10-12 ENCOUNTER — Emergency Department (HOSPITAL_BASED_OUTPATIENT_CLINIC_OR_DEPARTMENT_OTHER)
Admission: EM | Admit: 2021-10-12 | Discharge: 2021-10-12 | Disposition: A | Discharge status: Home / Self Care | Payer: PRIVATE HEALTH INSURANCE | Attending: Emergency Medicine | Admitting: Emergency Medicine

## 2021-10-12 DIAGNOSIS — R600 Localized edema: Secondary | ICD-10-CM | POA: Diagnosis not present

## 2021-10-12 DIAGNOSIS — R0789 Other chest pain: Secondary | ICD-10-CM | POA: Insufficient documentation

## 2021-10-12 DIAGNOSIS — J45909 Unspecified asthma, uncomplicated: Secondary | ICD-10-CM | POA: Insufficient documentation

## 2021-10-12 DIAGNOSIS — M7989 Other specified soft tissue disorders: Secondary | ICD-10-CM | POA: Diagnosis present

## 2021-10-12 DIAGNOSIS — R350 Frequency of micturition: Secondary | ICD-10-CM | POA: Diagnosis not present

## 2021-10-12 DIAGNOSIS — Z7951 Long term (current) use of inhaled steroids: Secondary | ICD-10-CM | POA: Insufficient documentation

## 2021-10-12 LAB — URINALYSIS, ROUTINE W REFLEX MICROSCOPIC
Bilirubin Urine: NEGATIVE
Glucose, UA: NEGATIVE mg/dL
Hgb urine dipstick: NEGATIVE
Ketones, ur: NEGATIVE mg/dL
Nitrite: NEGATIVE
Specific Gravity, Urine: 1.019 (ref 1.005–1.030)
pH: 6 (ref 5.0–8.0)

## 2021-10-12 LAB — D-DIMER, QUANTITATIVE: D-Dimer, Quant: <0.27 ug/mL-FEU (ref 0.00–0.50)

## 2021-10-12 LAB — CBC
HCT: 39 % (ref 36.0–46.0)
Hemoglobin: 12.4 g/dL (ref 12.0–15.0)
MCH: 29.2 pg (ref 26.0–34.0)
MCHC: 31.8 g/dL (ref 30.0–36.0)
MCV: 92 fL (ref 80.0–100.0)
Platelets: 261 10*3/uL (ref 150–400)
RBC: 4.24 MIL/uL (ref 3.87–5.11)
RDW: 12.8 % (ref 11.5–15.5)
WBC: 9.3 10*3/uL (ref 4.0–10.5)
nRBC: 0 % (ref 0.0–0.2)

## 2021-10-12 LAB — BRAIN NATRIURETIC PEPTIDE: B Natriuretic Peptide: 9.4 pg/mL (ref 0.0–100.0)

## 2021-10-12 LAB — BASIC METABOLIC PANEL
Anion gap: 7 (ref 5–15)
BUN: 15 mg/dL (ref 6–20)
CO2: 30 mmol/L (ref 22–32)
Calcium: 8.8 mg/dL — ABNORMAL LOW (ref 8.9–10.3)
Chloride: 104 mmol/L (ref 98–111)
Creatinine, Ser: 0.84 mg/dL (ref 0.44–1.00)
GFR, Estimated: >60 mL/min (ref >60)
Glucose, Bld: 93 mg/dL (ref 70–99)
Potassium: 3.4 mmol/L — ABNORMAL LOW (ref 3.5–5.1)
Sodium: 141 mmol/L (ref 135–145)

## 2021-10-12 LAB — TROPONIN I (HIGH SENSITIVITY): Troponin I (High Sensitivity): <2 ng/L (ref <18)

## 2021-10-12 NOTE — ED Provider Notes (Signed)
3:51 PM ?Care assumed from Dr. Langston Masker. ? ?At time of transfer care, patient is waiting for results of BNP and urinalysis.  Patient had negative D-dimer and ultrasound was negative for DVT thus ruling out thromboembolic etiology of her symptoms.  Patient had a single trop that was reassuring and felt to be adequate with previous team.  Patient is still waiting on a urinalysis and a BNP.  If those are reassuring, plan of care to discharge home. ? ?BMP reassuring.  Urinalysis showed leukocytes but no nitrates.  We will get culture as we discussed.  Patient agrees with plan of care and was discharged in good condition for outpatient follow-up ? ?Clinical Impression: ?1. Left leg swelling   ?2. Urinary frequency   ?3. Chest tightness   ? ? ?Disposition: Discharge ? ?Condition: Good ? ?I have discussed the results, Dx and Tx plan with the pt(& family if present). He/she/they expressed understanding and agree(s) with the plan. Discharge instructions discussed at great length. Strict return precautions discussed and pt &/or family have verbalized understanding of the instructions. No further questions at time of discharge.  ? ? ?Discharge Medication List as of 10/12/2021  4:36 PM  ?  ? ? ?Follow Up: ?[facility] ?[place] ?[place] [id number] ?[phone] ?Go to  ?If symptoms worsen ? ? ?  ?Ezme Duch, Gwenyth Allegra, MD ?10/12/21 1808 ? ?

## 2021-10-12 NOTE — ED Provider Notes (Signed)
?[facility] ?Provider Note ? ? ?CSN: [id number] ?Arrival date & time: 10/12/21  1336 ? ?  ? ?History ? ?Chief Complaint  ?Patient presents with  ? Leg Swelling  ? ? ?Phyllis Collins is a 56 y.o. female with history of asthma presenting emergency department with constellation of symptoms.  The patient was treated for an asthma exacerbation approximately 7 days ago in the emergency department, says she felt better from a breathing perspective after completing steroids 2 days ago, and has Trileptal at home, is still trying to get a nebulizer machine.  She returns today complaining of generalized chest tightness and discomfort, but also concerned because she feels like she has been having some tightness and swelling in her legs over the weekend, the past 3 days, more specifically the left upper leg.  She denies history of DVT or PE.  She is not on estrogen or hormone therapy.  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 ? ?She reports her chest pressure and tightness feels similar to her typical asthma.  It is very mild.  She denies history of diabetes, smoking.  She does report a significant family history of MI in her father at age 65. ? ?She separately reports she has had some urinary frequency over the past 2 days which is unusual for her.  It does not feel like her prior UTIs.  She does not have dysuria. ? ?HPI ? ?  ? ?Home Medications ?Prior to Admission medications   ?Medication Sig Start Date End Date Taking? Authorizing Provider  ?albuterol (VENTOLIN HFA) 108 (90 Base) MCG/ACT inhaler Inhale 2 puffs into the lungs every 4 (four) hours as needed for shortness of breath or wheezing. 05/01/20   [provider]  ?amoxicillin-clavulanate (AUGMENTIN) 875-125 MG tablet Take 1 tablet by mouth every 12 (twelve) hours. 12/09/20   Alfredia Client, PA-C  ?benzonatate (TESSALON) 100 MG capsule Take 1 capsule (100 mg total) by mouth every 8 (eight) hours. 04/23/21    Rayna Sexton, PA-C  ?clindamycin (CLEOCIN) 300 MG capsule Take 300 mg by mouth 3 (three) times daily.    [provider]  ?fexofenadine (ALLEGRA) 180 MG tablet Take 180 mg by mouth daily.    [provider]  ?fluticasone (FLONASE) 50 MCG/ACT nasal spray Place 1-2 sprays into both nostrils daily. 05/05/20   Alma Friendly, MD  ?montelukast (SINGULAIR) 10 MG tablet Take 10 mg by mouth at bedtime.    [provider]  ?Multiple Vitamin (MULTIVITAMIN) LIQD Take 5 mLs by mouth daily.    [provider]  ?oseltamivir (TAMIFLU) 75 MG capsule Take 1 capsule (75 mg total) by mouth every 12 (twelve) hours. 04/23/21   Rayna Sexton, PA-C  ?pantoprazole (PROTONIX) 40 MG tablet Take 1 tablet (40 mg total) by mouth daily. 05/05/20 06/04/20  Alma Friendly, MD  ?predniSONE (DELTASONE) 10 MG tablet Take 40 mg by mouth daily. 09/23/21   [provider]  ?Donnal Debar 100-62.5-25 MCG/INH AEPB Inhale 1 puff into the lungs daily. 04/22/20   [provider]  ?Donnal Debar 200-62.5-25 MCG/ACT AEPB Inhale 1 puff into the lungs daily. 09/15/21   [provider]  ?   ? ?Allergies    ?Hydrocodone, Ibuprofen, Nsaids, Nitrofurantoin monohyd macro, and Ciprofloxacin   ? ?Review of Systems   ?Review of Systems ? ?Physical Exam ?Updated Vital Signs ?BP 115/76   Pulse 81   Temp 97.8 ?F (36.6 ?C) (Oral)  Resp 18   Ht '5\' 2"'$  (1.575 m)   Wt 63.5 kg   SpO2 99%   BMI 25.60 kg/m?  ?Physical Exam ?Constitutional:   ?   General: She is not in acute distress. ?HENT:  ?   Head: Normocephalic and atraumatic.  ?Eyes:  ?   Conjunctiva/sclera: Conjunctivae normal.  ?   Pupils: Pupils are equal, round, and reactive to light.  ?Cardiovascular:  ?   Rate and Rhythm: Normal rate and regular rhythm.  ?Pulmonary:  ?   Effort: Pulmonary effort is normal. No respiratory distress.  ?   Breath sounds: Normal breath sounds. No stridor.  ?Abdominal:  ?   General: There is no  distension.  ?   Tenderness: There is no abdominal tenderness.  ?Musculoskeletal:  ?   Comments: No visible unilateral swelling of lower extremity, no posterior calf or upper leg tenderness on exam  ?Skin: ?   General: Skin is warm and dry.  ?Neurological:  ?   General: No focal deficit present.  ?   Mental Status: She is alert. Mental status is at baseline.  ?Psychiatric:     ?   Mood and Affect: Mood normal.     ?   Behavior: Behavior normal.  ? ? ?ED Results / Procedures / Treatments   ?Labs ?(all labs ordered are listed, but only abnormal results are displayed) ?Labs Reviewed  ?BASIC METABOLIC PANEL - Abnormal; Notable for the following components:  ?    Result Value  ? Potassium 3.4 (*)   ? Calcium 8.8 (*)   ? All other components within normal limits  ?BRAIN NATRIURETIC PEPTIDE  ?CBC  ?D-DIMER, QUANTITATIVE  ?URINALYSIS, ROUTINE W REFLEX MICROSCOPIC  ?TROPONIN I (HIGH SENSITIVITY)  ? ? ?EKG ?None ? ?Radiology ?US Venous Img Lower  Left (DVT Study) ? ?Result Date: 10/12/2021 ?CLINICAL DATA:  Left leg swelling and pain for 3 days EXAM: LEFT LOWER EXTREMITY VENOUS DOPPLER ULTRASOUND TECHNIQUE: Gray-scale sonography with graded compression, as well as color Doppler and duplex ultrasound were performed to evaluate the lower extremity deep venous systems from the level of the common femoral vein and including the common femoral, femoral, profunda femoral, popliteal and calf veins including the posterior tibial, peroneal and gastrocnemius veins when visible. The superficial great saphenous vein was also interrogated. Spectral Doppler was utilized to evaluate flow at rest and with distal augmentation maneuvers in the common femoral, femoral and popliteal veins. COMPARISON:  None. FINDINGS: Contralateral Common Femoral Vein: Respiratory phasicity is normal and symmetric with the symptomatic side. No evidence of thrombus. Normal compressibility. Common Femoral Vein: No evidence of thrombus. Normal compressibility,  respiratory phasicity and response to augmentation. Saphenofemoral Junction: No evidence of thrombus. Normal compressibility and flow on color Doppler imaging. Profunda Femoral Vein: No evidence of thrombus. Normal compressibility and flow on color Doppler imaging. Femoral Vein: No evidence of thrombus. Normal compressibility, respiratory phasicity and response to augmentation. Popliteal Vein: No evidence of thrombus. Normal compressibility, respiratory phasicity and response to augmentation. Calf Veins: No evidence of thrombus. Normal compressibility and flow on color Doppler imaging. Superficial Great Saphenous Vein: No evidence of thrombus. Normal compressibility. IMPRESSION: No evidence of deep venous thrombosis. Electronically Signed   By: Jerilynn Mages.  Shick M.D.   On: 10/12/2021 14:51   ? ?Procedures ?Procedures  ? ? ?Medications Ordered in ED ?Medications - No data to display ? ?ED Course/ Medical Decision Making/ A&P ?Clinical Course as of 10/12/21 1553  ?Mon Oct 12, 2021  ?1551 Signed out  to Dr Dorma Russell EDP pending results of BNP and UA [MT]  ?1552 Patient reassessed remains clinically stable at this time, normal vital signs, no distress.  We discussed the work-up so far, undetectable troponin and D-dimer lower my suspicion for ACS or pulmonary embolism.  We are awaiting UA.  Anticipate she will be discharged and need outpatient follow-up.  She verbalized understanding [MT]  ?  ?Clinical Course User Index ?[MT] Wyvonnia Dusky, MD  ? ?                        ?Medical Decision Making ?Amount and/or Complexity of Data Reviewed ?Labs: ordered. ? ? ?This patient presents to the ED with concern for leg swelling, chest tightness, urinary frequency. This involves an extensive number of treatment options, and is a complaint that carries with it a high risk of complications and morbidity.  The differential diagnosis includes UTI versus new onset congestive heart failure versus DVT versus infection versus side effects from  prednisone versus other ? ?External records from outside source obtained and reviewed including x-ray of the chest performed 1 week ago in the emergency department showing no focal infiltrate or abnormalities.  She has a cl

## 2021-10-12 NOTE — ED Triage Notes (Signed)
Patient here POV from Home. ? ?Endorses Generalized Swelling but Primarily to LLE for approximately 2-3 days. Worsened since it began. ? ?Endorses Pain to Mid-Chest since Yesterday which the Patient attributes to Asthma. ? ?NAD noted during Triage. A&Ox4. GCS 15. Ambulatory. ?

## 2021-10-13 LAB — URINE CULTURE: Culture: NO GROWTH

## 2021-11-16 ENCOUNTER — Observation Stay (HOSPITAL_COMMUNITY): Payer: PRIVATE HEALTH INSURANCE

## 2021-11-16 ENCOUNTER — Encounter (HOSPITAL_BASED_OUTPATIENT_CLINIC_OR_DEPARTMENT_OTHER): Payer: Self-pay | Admitting: Urology

## 2021-11-16 ENCOUNTER — Observation Stay (HOSPITAL_BASED_OUTPATIENT_CLINIC_OR_DEPARTMENT_OTHER)
Admission: EM | Admit: 2021-11-16 | Discharge: 2021-11-17 | Disposition: A | Discharge status: Home / Self Care | Payer: PRIVATE HEALTH INSURANCE | Attending: Internal Medicine | Admitting: Internal Medicine

## 2021-11-16 ENCOUNTER — Other Ambulatory Visit: Payer: Self-pay

## 2021-11-16 ENCOUNTER — Emergency Department (HOSPITAL_BASED_OUTPATIENT_CLINIC_OR_DEPARTMENT_OTHER): Payer: PRIVATE HEALTH INSURANCE

## 2021-11-16 DIAGNOSIS — Z79899 Other long term (current) drug therapy: Secondary | ICD-10-CM | POA: Diagnosis not present

## 2021-11-16 DIAGNOSIS — J45901 Unspecified asthma with (acute) exacerbation: Secondary | ICD-10-CM | POA: Diagnosis not present

## 2021-11-16 DIAGNOSIS — R0602 Shortness of breath: Secondary | ICD-10-CM | POA: Diagnosis present

## 2021-11-16 DIAGNOSIS — J45902 Unspecified asthma with status asthmaticus: Secondary | ICD-10-CM

## 2021-11-16 DIAGNOSIS — R202 Paresthesia of skin: Secondary | ICD-10-CM

## 2021-11-16 DIAGNOSIS — Z20822 Contact with and (suspected) exposure to covid-19: Secondary | ICD-10-CM | POA: Insufficient documentation

## 2021-11-16 DIAGNOSIS — E876 Hypokalemia: Secondary | ICD-10-CM | POA: Diagnosis not present

## 2021-11-16 LAB — I-STAT VENOUS BLOOD GAS, ED
Acid-base deficit: 1 mmol/L (ref 0.0–2.0)
Bicarbonate: 25.6 mmol/L (ref 20.0–28.0)
Calcium, Ion: 1.24 mmol/L (ref 1.15–1.40)
HCT: 40 % (ref 36.0–46.0)
Hemoglobin: 13.6 g/dL (ref 12.0–15.0)
O2 Saturation: 66 %
Potassium: 2.9 mmol/L — ABNORMAL LOW (ref 3.5–5.1)
Sodium: 140 mmol/L (ref 135–145)
TCO2: 27 mmol/L (ref 22–32)
pCO2, Ven: 50 mmHg (ref 44–60)
pH, Ven: 7.318 (ref 7.25–7.43)
pO2, Ven: 38 mmHg (ref 32–45)

## 2021-11-16 LAB — CBC WITH DIFFERENTIAL/PLATELET
Abs Immature Granulocytes: 0.03 10*3/uL (ref 0.00–0.07)
Basophils Absolute: 0.1 10*3/uL (ref 0.0–0.1)
Basophils Relative: 1 %
Eosinophils Absolute: 0.8 10*3/uL — ABNORMAL HIGH (ref 0.0–0.5)
Eosinophils Relative: 12 %
HCT: 42 % (ref 36.0–46.0)
Hemoglobin: 14.1 g/dL (ref 12.0–15.0)
Immature Granulocytes: 1 %
Lymphocytes Relative: 40 %
Lymphs Abs: 2.4 10*3/uL (ref 0.7–4.0)
MCH: 30.2 pg (ref 26.0–34.0)
MCHC: 33.6 g/dL (ref 30.0–36.0)
MCV: 89.9 fL (ref 80.0–100.0)
Monocytes Absolute: 0.7 10*3/uL (ref 0.1–1.0)
Monocytes Relative: 11 %
Neutro Abs: 2.2 10*3/uL (ref 1.7–7.7)
Neutrophils Relative %: 35 %
Platelets: 222 10*3/uL (ref 150–400)
RBC: 4.67 MIL/uL (ref 3.87–5.11)
RDW: 12.5 % (ref 11.5–15.5)
WBC: 6.1 10*3/uL (ref 4.0–10.5)
nRBC: 0 % (ref 0.0–0.2)

## 2021-11-16 LAB — BASIC METABOLIC PANEL
Anion gap: 7 (ref 5–15)
BUN: 12 mg/dL (ref 6–20)
CO2: 26 mmol/L (ref 22–32)
Calcium: 8.7 mg/dL — ABNORMAL LOW (ref 8.9–10.3)
Chloride: 104 mmol/L (ref 98–111)
Creatinine, Ser: 0.85 mg/dL (ref 0.44–1.00)
GFR, Estimated: >60 mL/min (ref >60)
Glucose, Bld: 108 mg/dL — ABNORMAL HIGH (ref 70–99)
Potassium: 3.1 mmol/L — ABNORMAL LOW (ref 3.5–5.1)
Sodium: 137 mmol/L (ref 135–145)

## 2021-11-16 LAB — MAGNESIUM: Magnesium: 2.1 mg/dL (ref 1.7–2.4)

## 2021-11-16 LAB — MRSA NEXT GEN BY PCR, NASAL: MRSA by PCR Next Gen: NOT DETECTED

## 2021-11-16 LAB — SARS CORONAVIRUS 2 BY RT PCR: SARS Coronavirus 2 by RT PCR: NEGATIVE

## 2021-11-16 LAB — HIV ANTIBODY (ROUTINE TESTING W REFLEX): HIV Screen 4th Generation wRfx: NONREACTIVE

## 2021-11-16 MED ORDER — ALBUTEROL SULFATE (2.5 MG/3ML) 0.083% IN NEBU: 10.0000 mg/h | INHALATION_SOLUTION | RESPIRATORY_TRACT | Status: DC

## 2021-11-16 MED ORDER — IPRATROPIUM-ALBUTEROL 0.5-2.5 (3) MG/3ML IN SOLN: 3.0000 mL | Freq: Once | RESPIRATORY_TRACT | Status: AC

## 2021-11-16 MED ORDER — ACETAMINOPHEN 325 MG PO TABS: 650.0000 mg | ORAL_TABLET | Freq: Four times a day (QID) | ORAL | Status: DC | PRN

## 2021-11-16 MED ORDER — IPRATROPIUM BROMIDE 0.02 % IN SOLN
0.5000 mg | Freq: Once | RESPIRATORY_TRACT | Status: DC
  Filled 2021-11-16: qty 2.5

## 2021-11-16 MED ORDER — MAGNESIUM SULFATE 2 GM/50ML IV SOLN
2.0000 g | Freq: Once | INTRAVENOUS | Status: AC
  Administered 2021-11-16: 2 g via INTRAVENOUS
  Filled 2021-11-16: qty 50

## 2021-11-16 MED ORDER — LORATADINE 10 MG PO TABS
10.0000 mg | ORAL_TABLET | Freq: Every day | ORAL | Status: DC
  Filled 2021-11-16: qty 1

## 2021-11-16 MED ORDER — GUAIFENESIN ER 600 MG PO TB12
600.0000 mg | ORAL_TABLET | Freq: Two times a day (BID) | ORAL | Status: DC
Start: 2021-11-16 — End: 2021-11-17
  Administered 2021-11-16 – 2021-11-17 (×3): 600 mg via ORAL
  Filled 2021-11-16 (×3): qty 1

## 2021-11-16 MED ORDER — FLUTICASONE PROPIONATE 50 MCG/ACT NA SUSP
1.0000 | Freq: Two times a day (BID) | NASAL | Status: DC
  Administered 2021-11-16: 1 via NASAL
  Filled 2021-11-16: qty 16

## 2021-11-16 MED ORDER — ALBUTEROL SULFATE (2.5 MG/3ML) 0.083% IN NEBU
INHALATION_SOLUTION | RESPIRATORY_TRACT | Status: AC
  Administered 2021-11-16: 2.5 mg via RESPIRATORY_TRACT
  Filled 2021-11-16: qty 3

## 2021-11-16 MED ORDER — MONTELUKAST SODIUM 10 MG PO TABS
10.0000 mg | ORAL_TABLET | Freq: Every day | ORAL | Status: DC
Start: 2021-11-16 — End: 2021-11-17
  Administered 2021-11-16: 10 mg via ORAL
  Filled 2021-11-16: qty 1

## 2021-11-16 MED ORDER — ACETAMINOPHEN 650 MG RE SUPP: 650.0000 mg | Freq: Four times a day (QID) | RECTAL | Status: DC | PRN

## 2021-11-16 MED ORDER — BUDESONIDE 0.5 MG/2ML IN SUSP
2.0000 mL | Freq: Every day | RESPIRATORY_TRACT | Status: DC
  Administered 2021-11-16: 0.5 mg via RESPIRATORY_TRACT
  Filled 2021-11-16: qty 2

## 2021-11-16 MED ORDER — CHLORHEXIDINE GLUCONATE CLOTH 2 % EX PADS
6.0000 | MEDICATED_PAD | Freq: Every day | CUTANEOUS | Status: DC
Start: 2021-11-16 — End: 2021-11-17
  Administered 2021-11-16: 6 via TOPICAL

## 2021-11-16 MED ORDER — ALBUTEROL SULFATE (2.5 MG/3ML) 0.083% IN NEBU: 2.5000 mg | INHALATION_SOLUTION | Freq: Once | RESPIRATORY_TRACT | Status: AC

## 2021-11-16 MED ORDER — IPRATROPIUM-ALBUTEROL 0.5-2.5 (3) MG/3ML IN SOLN
RESPIRATORY_TRACT | Status: AC
  Administered 2021-11-16: 3 mL via RESPIRATORY_TRACT
  Filled 2021-11-16: qty 3

## 2021-11-16 MED ORDER — ALBUTEROL SULFATE (2.5 MG/3ML) 0.083% IN NEBU
2.5000 mg | INHALATION_SOLUTION | RESPIRATORY_TRACT | Status: DC | PRN
  Administered 2021-11-16 – 2021-11-17 (×3): 2.5 mg via RESPIRATORY_TRACT
  Filled 2021-11-16 (×2): qty 3

## 2021-11-16 MED ORDER — SODIUM CHLORIDE 0.9 % IV SOLN: INTRAVENOUS | Status: DC | PRN

## 2021-11-16 MED ORDER — FLUTICASONE PROPIONATE 93 MCG/ACT NA EXHU: 1.0000 | INHALANT_SUSPENSION | Freq: Two times a day (BID) | NASAL | Status: DC

## 2021-11-16 MED ORDER — ALBUTEROL SULFATE (2.5 MG/3ML) 0.083% IN NEBU
2.5000 mg | INHALATION_SOLUTION | Freq: Four times a day (QID) | RESPIRATORY_TRACT | Status: DC
  Administered 2021-11-16: 2.5 mg via RESPIRATORY_TRACT
  Filled 2021-11-16 (×2): qty 3

## 2021-11-16 MED ORDER — ALBUTEROL SULFATE (2.5 MG/3ML) 0.083% IN NEBU
10.0000 mg/h | INHALATION_SOLUTION | RESPIRATORY_TRACT | Status: AC
  Administered 2021-11-16: 10 mg/h via RESPIRATORY_TRACT
  Filled 2021-11-16: qty 12

## 2021-11-16 MED ORDER — POTASSIUM CHLORIDE CRYS ER 20 MEQ PO TBCR
40.0000 meq | EXTENDED_RELEASE_TABLET | Freq: Two times a day (BID) | ORAL | Status: DC
  Administered 2021-11-16 (×2): 40 meq via ORAL
  Filled 2021-11-16 (×2): qty 2

## 2021-11-16 MED ORDER — METHYLPREDNISOLONE SODIUM SUCC 125 MG IJ SOLR
125.0000 mg | INTRAMUSCULAR | Status: AC
  Administered 2021-11-16: 125 mg via INTRAVENOUS
  Filled 2021-11-16: qty 2

## 2021-11-16 NOTE — H&P (Signed)
History and Physical    Patient: Phyllis Collins RXV:[id number] DOB: [birth year] DOA: 11/16/2021 DOS: the patient was seen and examined on 11/16/2021 PCP: Robyne Peers, MD  Patient coming from: Home  Chief Complaint:  Chief Complaint  Patient presents with   Shortness of Breath   HPI: Phyllis Collins is a 56 y.o. female with medical history significant of asthma. Presenting with shortness of breath. Symptoms started today ago. It was accompanied by cough. She Didn't have any fever. She didn't have any sick contacts. Her inhalers where initially helping, but then became ineffective by yesterday. She tried nebs, but they didn't help. When her symptoms did not improve last night, she decided to come to the ED for assistance. In addition to the above symptoms, she reports an intermittent numbness and tingling in her left arm for the last week. She has not had any focal weakness. No other neurological issues reported. She denies any other aggravating or alleviating factors.   Review of Systems: As mentioned in the history of present illness. All other systems reviewed and are negative. Past Medical History:  Diagnosis Date   Asthma    Past Surgical History:  Procedure Laterality Date   CESAREAN SECTION     FOOT SURGERY     Social History:  reports that she has never smoked. She has never used smokeless tobacco. She reports that she does not currently use alcohol. She reports that she does not use drugs.  Allergies  Allergen Reactions   Hydrocodone Hives   Ibuprofen Anaphylaxis   Nsaids Anaphylaxis   Nitrofurantoin Monohyd Macro    Ciprofloxacin Rash    Family History  Problem Relation Age of Onset   Sudden death Father    Heart attack Father    Hypertension Paternal Grandmother    High Cholesterol Mother    Hypertension Mother    Diabetes Neg Hx    Hyperlipidemia Neg Hx     Prior to Admission medications   Medication Sig Start Date End Date Taking?  Authorizing Provider  albuterol (PROVENTIL) (2.5 MG/3ML) 0.083% nebulizer solution Inhale 3 mLs into the lungs every 6 (six) hours as needed for shortness of breath or wheezing. 09/14/21 09/14/22 Yes [provider]  albuterol (VENTOLIN HFA) 108 (90 Base) MCG/ACT inhaler Inhale 2 puffs into the lungs every 4 (four) hours as needed for shortness of breath or wheezing. 05/01/20  Yes [provider]  budesonide (PULMICORT) 0.5 MG/2ML nebulizer solution Inhale 2 mLs into the lungs daily. 09/14/21 09/14/22 Yes [provider]  fexofenadine (ALLEGRA) 180 MG tablet Take 180 mg by mouth daily.   Yes [provider]  Fluticasone Propionate (XHANCE) 93 MCG/ACT EXHU Place 1 spray into both nostrils 2 (two) times daily.   Yes [provider]  montelukast (SINGULAIR) 10 MG tablet Take 10 mg by mouth at bedtime.   Yes [provider]  amoxicillin-clavulanate (AUGMENTIN) 875-125 MG tablet Take 1 tablet by mouth every 12 (twelve) hours. 12/09/20   Alfredia Client, PA-C  benzonatate (TESSALON) 100 MG capsule Take 1 capsule (100 mg total) by mouth every 8 (eight) hours. 04/23/21   Rayna Sexton, PA-C  clindamycin (CLEOCIN) 300 MG capsule Take 300 mg by mouth 3 (three) times daily.    [provider]  fluticasone (FLONASE) 50 MCG/ACT nasal spray Place 1-2 sprays into both nostrils daily. 05/05/20   Alma Friendly, MD  Multiple Vitamin (MULTIVITAMIN) LIQD Take 5 mLs by mouth daily.    [provider]  oseltamivir (TAMIFLU) 75 MG capsule Take 1 capsule (75 mg total) by mouth every 12 (twelve) hours. 04/23/21   Rayna Sexton, PA-C  pantoprazole (PROTONIX) 40 MG tablet Take 1 tablet (40 mg total) by mouth daily. 05/05/20 06/04/20  Alma Friendly, MD  predniSONE (DELTASONE) 10 MG tablet Take 40 mg by mouth daily. 09/23/21   [provider]  Donnal Debar 200-62.5-25 MCG/ACT AEPB Inhale 1 puff into the lungs daily. Patient not taking:  Reported on 11/16/2021 09/15/21   [provider]    Physical Exam: Vitals:   11/16/21 0730 11/16/21 0805 11/16/21 0826 11/16/21 0925  BP: 130/73  116/68 105/62  Pulse: (!) 103  (!) 113 (!) 115  Resp: '18  19 19  '$ Temp:   97.9 F (36.6 C) 98 F (36.7 C)  TempSrc:   Oral Oral  SpO2: 98% 92% 96% 100%  Weight:      Height:       General: 56 y.o. female resting in bed in NAD Eyes: PERRL, normal sclera ENMT: Nares patent w/o discharge, orophaynx clear, dentition normal, ears w/o discharge/lesions/ulcers Neck: Supple, trachea midline Cardiovascular: RRR, +S1, S2, 1/6 SEM, no g/r, equal pulses throughout Respiratory: inspiratory wheeze at bases, no r/r, normal WOB on 2L GI: BS+, NDNT, no masses noted, no organomegaly noted MSK: No e/c/c Skin: No rashes, bruises, ulcerations noted Neuro: A&O x 3, persistent tingling in LUE but sensation is intact, no weakness Psyc: Appropriate interaction and affect, calm/cooperative  Data Reviewed:  Na+  140 K+  2.9 Scr  0.85 WBC 6.1 Hgb  14.1 COVID negative  CXR: No radiographic evidence of acute cardiopulmonary disease.  Assessment and Plan: Asthma exacerbation     - placed on obs, SDU; can downgrade to tele     - nebs, steroids, guaifenesin     - wean O2 as able     - CXR as above, COVID negative  Hypokalemia     - replace K+; check Mg2+  LUE parathesia     - intermittent episodes going for the last week     - she has intact sensation and there is no focal weakness     - check CTH     - if imaging is negative, she can be referred to outpt neuro  Advance Care Planning:   Code Status: FULL  Consults: None  Family Communication: None at beside  Severity of Illness: The appropriate patient status for this patient is OBSERVATION. Observation status is judged to be reasonable and necessary in order to provide the required intensity of service to ensure the patient's safety. The patient's presenting symptoms, physical exam  findings, and initial radiographic and laboratory data in the context of their medical condition is felt to place them at decreased risk for further clinical deterioration. Furthermore, it is anticipated that the patient will be medically stable for discharge from the hospital within 2 midnights of admission.   Author: Jonnie Finner, DO 11/16/2021 10:27 AM  For on call review [email or web address].

## 2021-11-16 NOTE — Plan of Care (Signed)
  Problem: Health Behavior/Discharge Planning: Goal: Ability to manage health-related needs will improve Outcome: Progressing   Problem: Clinical Measurements: Goal: Ability to maintain clinical measurements within normal limits will improve Outcome: Progressing Goal: Will remain free from infection Outcome: Progressing Goal: Diagnostic test results will improve Outcome: Progressing Goal: Respiratory complications will improve Outcome: Progressing Goal: Cardiovascular complication will be avoided Outcome: Progressing   Problem: Activity: Goal: Risk for activity intolerance will decrease Outcome: Progressing   Problem: Elimination: Goal: Will not experience complications related to bowel motility Outcome: Progressing   Problem: Pain Managment: Goal: General experience of comfort will improve Outcome: Progressing   Problem: Safety: Goal: Ability to remain free from injury will improve Outcome: Progressing   Problem: Skin Integrity: Goal: Risk for impaired skin integrity will decrease Outcome: Progressing

## 2021-11-16 NOTE — ED Notes (Signed)
EDP aware of ABG refusal.  Requested a VBG draw instead.

## 2021-11-16 NOTE — ED Provider Notes (Addendum)
[facility] EMERG[facility] DEPARTMENT Provider Note   CSN: [id number] Arrival date & time: 11/16/21  0531     History  Chief Complaint  Patient presents with   Shortness of Breath    Phyllis Collins is a 56 y.o. female.   Shortness of Breath Severity:  Severe Onset quality:  Gradual Duration:  1 week Timing:  Constant Progression:  Worsening Chronicity:  Recurrent Context: not smoke exposure and not URI   Relieved by:  Nothing Worsened by:  Nothing Ineffective treatments:  None tried Associated symptoms: wheezing   Associated symptoms: no chest pain and no fever   Risk factors: no tobacco use   Patient with asthma on nebulizers and inhalers but not currently taking trelogy presents with worsening asthma x 1 week.  No f/c/r.      Home Medications Prior to Admission medications   Medication Sig Start Date End Date Taking? Authorizing Provider  albuterol (VENTOLIN HFA) 108 (90 Base) MCG/ACT inhaler Inhale 2 puffs into the lungs every 4 (four) hours as needed for shortness of breath or wheezing. 05/01/20   [provider]  amoxicillin-clavulanate (AUGMENTIN) 875-125 MG tablet Take 1 tablet by mouth every 12 (twelve) hours. 12/09/20   Alfredia Client, PA-C  benzonatate (TESSALON) 100 MG capsule Take 1 capsule (100 mg total) by mouth every 8 (eight) hours. 04/23/21   Rayna Sexton, PA-C  clindamycin (CLEOCIN) 300 MG capsule Take 300 mg by mouth 3 (three) times daily.    [provider]  fexofenadine (ALLEGRA) 180 MG tablet Take 180 mg by mouth daily.    [provider]  fluticasone (FLONASE) 50 MCG/ACT nasal spray Place 1-2 sprays into both nostrils daily. 05/05/20   Alma Friendly, MD  montelukast (SINGULAIR) 10 MG tablet Take 10 mg by mouth at bedtime.    [provider]  Multiple Vitamin (MULTIVITAMIN) LIQD Take 5 mLs by mouth daily.    [provider]  oseltamivir (TAMIFLU) 75 MG capsule Take 1 capsule (75 mg  total) by mouth every 12 (twelve) hours. 04/23/21   Rayna Sexton, PA-C  pantoprazole (PROTONIX) 40 MG tablet Take 1 tablet (40 mg total) by mouth daily. 05/05/20 06/04/20  Alma Friendly, MD  predniSONE (DELTASONE) 10 MG tablet Take 40 mg by mouth daily. 09/23/21   [provider]  TRELEGY ELLIPTA 100-62.5-25 MCG/INH AEPB Inhale 1 puff into the lungs daily. 04/22/20   [provider]  Donnal Debar 200-62.5-25 MCG/ACT AEPB Inhale 1 puff into the lungs daily. 09/15/21   [provider]      Allergies    Hydrocodone, Ibuprofen, Nsaids, Nitrofurantoin monohyd macro, and Ciprofloxacin    Review of Systems   Review of Systems  Unable to perform ROS: Acuity of condition  Constitutional:  Negative for fever.  HENT:  Negative for facial swelling.   Eyes:  Negative for redness.  Respiratory:  Positive for shortness of breath and wheezing.   Cardiovascular:  Negative for chest pain.   Physical Exam Updated Vital Signs BP 126/71   Pulse (!) 105   Temp 97.9 F (36.6 C) (Oral)   Resp 19   Ht '5\' 2"'$  (1.575 m)   Wt 63.5 kg   LMP 10/25/2021 (Approximate)   SpO2 91%   BMI 25.60 kg/m  Physical Exam Vitals and nursing note reviewed. Exam conducted with a chaperone present.  Constitutional:      General: She is not in acute distress.    Appearance: Normal appearance. She is well-developed. She  is not ill-appearing.  HENT:     Head: Normocephalic and atraumatic.     Nose: Nose normal.  Eyes:     Comments: Normal appearance  Cardiovascular:     Rate and Rhythm: Regular rhythm. Tachycardia present.     Pulses: Normal pulses.     Heart sounds: Normal heart sounds.  Pulmonary:     Effort: Tachypnea, accessory muscle usage and prolonged expiration present. No respiratory distress.     Breath sounds: Decreased air movement present. Wheezing present.  Abdominal:     General: Bowel sounds are normal. There is no distension.     Palpations: Abdomen is soft. There  is no mass.     Tenderness: There is no abdominal tenderness. There is no guarding or rebound.  Genitourinary:    Comments: No CVA tenderness Musculoskeletal:        General: Normal range of motion.     Cervical back: Normal range of motion.  Skin:    General: Skin is warm and dry.     Capillary Refill: Capillary refill takes less than 2 seconds.     Findings: No rash.  Neurological:     General: No focal deficit present.     Mental Status: She is alert and oriented to person, place, and time.  Psychiatric:        Mood and Affect: Mood normal.        Behavior: Behavior normal.    ED Results / Procedures / Treatments   Labs (all labs ordered are listed, but only abnormal results are displayed) Results for orders placed or performed during the hospital encounter of 11/16/21  CBC with Differential  Result Value Ref Range   WBC 6.1 4.0 - 10.5 K/uL   RBC 4.67 3.87 - 5.11 MIL/uL   Hemoglobin 14.1 12.0 - 15.0 g/dL   HCT 42.0 36.0 - 46.0 %   MCV 89.9 80.0 - 100.0 fL   MCH 30.2 26.0 - 34.0 pg   MCHC 33.6 30.0 - 36.0 g/dL   RDW 12.5 11.5 - 15.5 %   Platelets 222 150 - 400 K/uL   nRBC 0.0 0.0 - 0.2 %   Neutrophils Relative % 35 %   Neutro Abs 2.2 1.7 - 7.7 K/uL   Lymphocytes Relative 40 %   Lymphs Abs 2.4 0.7 - 4.0 K/uL   Monocytes Relative 11 %   Monocytes Absolute 0.7 0.1 - 1.0 K/uL   Eosinophils Relative 12 %   Eosinophils Absolute 0.8 (H) 0.0 - 0.5 K/uL   Basophils Relative 1 %   Basophils Absolute 0.1 0.0 - 0.1 K/uL   Immature Granulocytes 1 %   Abs Immature Granulocytes 0.03 0.00 - 0.07 K/uL  Basic metabolic panel  Result Value Ref Range   Sodium 137 135 - 145 mmol/L   Potassium 3.1 (L) 3.5 - 5.1 mmol/L   Chloride 104 98 - 111 mmol/L   CO2 26 22 - 32 mmol/L   Glucose, Bld 108 (H) 70 - 99 mg/dL   BUN 12 6 - 20 mg/dL   Creatinine, Ser 0.85 0.44 - 1.00 mg/dL   Calcium 8.7 (L) 8.9 - 10.3 mg/dL   GFR, Estimated >60 >60 mL/min   Anion gap 7 5 - 15   No results  found.  Procedures Procedures    Medications Ordered in ED Medications  magnesium sulfate IVPB 2 g 50 mL (2 g Intravenous New Bag/Given 11/16/21 0550)  0.9 %  sodium chloride infusion ( Intravenous New Bag/Given 11/16/21  7017)  albuterol (PROVENTIL) (2.5 MG/3ML) 0.083% nebulizer solution (10 mg/hr Nebulization New Bag/Given 11/16/21 0626)  ipratropium (ATROVENT) nebulizer solution 0.5 mg (has no administration in time range)  ipratropium-albuterol (DUONEB) 0.5-2.5 (3) MG/3ML nebulizer solution 3 mL (3 mLs Nebulization Given 11/16/21 0550)  albuterol (PROVENTIL) (2.5 MG/3ML) 0.083% nebulizer solution 2.5 mg (2.5 mg Nebulization Given 11/16/21 0543)  methylPREDNISolone sodium succinate (SOLU-MEDROL) 125 mg/2 mL injection 125 mg (125 mg Intravenous Given 11/16/21 0544)    ED Course/ Medical Decision Making/ A&P                           Medical Decision Making Asthma attack x 1 week, worse x 2 days   Amount and/or Complexity of Data Reviewed External Data Reviewed: notes.    Details: previous notes reviewed Labs: ordered.    Details: all labs ordered by me: normal CBC, wbc 6.1, normal hemoglobine 14.1 and normal platelet count. Chemistry normal sodium potassium low at 3.1 normal BUN and creatinine .40 Radiology: ordered and independent interpretation performed.    Details: NACPD by me on CXR Discussion of management or test interpretation with external provider(s): Case d/w Dr. Hal Hope who will admit the patient   Risk Prescription drug management. Decision regarding hospitalization.  Critical Care Total time providing critical care: 75 minutes (Continuous nebs steroids and magnesium )  Final Clinical Impression(s) / ED Diagnoses Final diagnoses:  Asthma with status asthmaticus, unspecified asthma severity, unspecified whether persistent    Admit to triad     King Pinzon, MD 11/16/21 7939

## 2021-11-16 NOTE — ED Notes (Signed)
ABG attempted x1 by previous RT.  Patient requesting to hold on ABG testing at this time.  After CAT neb completed SpO2 dropped to 92% on r/a.  Placed on 2lpm [place] SpO2 96%, resting comfortably now.

## 2021-11-16 NOTE — ED Triage Notes (Signed)
Pt states SOB over past week with worsening wheezing and SOB the past 2 days H/o asthma  Labored breathing noted  Last used inhaler 1 hr PTA and last used Nebulizer approx 3 hrs PTA   Respiratory at bedside and Provider at bedside

## 2021-11-17 ENCOUNTER — Observation Stay (HOSPITAL_COMMUNITY): Payer: PRIVATE HEALTH INSURANCE

## 2021-11-17 DIAGNOSIS — J45901 Unspecified asthma with (acute) exacerbation: Secondary | ICD-10-CM | POA: Diagnosis not present

## 2021-11-17 LAB — CBC
HCT: 41.8 % (ref 36.0–46.0)
Hemoglobin: 13.7 g/dL (ref 12.0–15.0)
MCH: 30.2 pg (ref 26.0–34.0)
MCHC: 32.8 g/dL (ref 30.0–36.0)
MCV: 92.3 fL (ref 80.0–100.0)
Platelets: 257 10*3/uL (ref 150–400)
RBC: 4.53 MIL/uL (ref 3.87–5.11)
RDW: 12.9 % (ref 11.5–15.5)
WBC: 18.4 10*3/uL — ABNORMAL HIGH (ref 4.0–10.5)
nRBC: 0 % (ref 0.0–0.2)

## 2021-11-17 LAB — COMPREHENSIVE METABOLIC PANEL
ALT: 17 U/L (ref 0–44)
AST: 18 U/L (ref 15–41)
Albumin: 3.5 g/dL (ref 3.5–5.0)
Alkaline Phosphatase: 76 U/L (ref 38–126)
Anion gap: 6 (ref 5–15)
BUN: 14 mg/dL (ref 6–20)
CO2: 25 mmol/L (ref 22–32)
Calcium: 9.5 mg/dL (ref 8.9–10.3)
Chloride: 110 mmol/L (ref 98–111)
Creatinine, Ser: 0.83 mg/dL (ref 0.44–1.00)
GFR, Estimated: >60 mL/min (ref >60)
Glucose, Bld: 112 mg/dL — ABNORMAL HIGH (ref 70–99)
Potassium: 4.4 mmol/L (ref 3.5–5.1)
Sodium: 141 mmol/L (ref 135–145)
Total Bilirubin: 0.4 mg/dL (ref 0.3–1.2)
Total Protein: 7.2 g/dL (ref 6.5–8.1)

## 2021-11-17 MED ORDER — PREDNISONE 50 MG PO TABS: 50.0000 mg | ORAL_TABLET | Freq: Every day | ORAL | 0 refills | Status: AC

## 2021-11-17 MED ORDER — MAGNESIUM SULFATE IN D5W 1-5 GM/100ML-% IV SOLN
1.0000 g | Freq: Once | INTRAVENOUS | Status: DC
Start: 2021-11-17 — End: 2021-11-17

## 2021-11-17 MED ORDER — MAGNESIUM SULFATE 2 GM/50ML IV SOLN
2.0000 g | Freq: Once | INTRAVENOUS | Status: AC
  Administered 2021-11-17: 2 g via INTRAVENOUS
  Filled 2021-11-17: qty 50

## 2021-11-17 MED ORDER — ALBUTEROL SULFATE (2.5 MG/3ML) 0.083% IN NEBU
10.0000 mg/h | INHALATION_SOLUTION | RESPIRATORY_TRACT | Status: DC
  Administered 2021-11-17: 10 mg/h via RESPIRATORY_TRACT
  Filled 2021-11-17: qty 12

## 2021-11-17 MED ORDER — METHYLPREDNISOLONE SODIUM SUCC 125 MG IJ SOLR
125.0000 mg | INTRAMUSCULAR | Status: AC
  Administered 2021-11-17: 125 mg via INTRAVENOUS
  Filled 2021-11-17: qty 2

## 2021-11-17 MED ORDER — MOMETASONE FURO-FORMOTEROL FUM 100-5 MCG/ACT IN AERO
2.0000 | INHALATION_SPRAY | Freq: Two times a day (BID) | RESPIRATORY_TRACT | Status: DC
  Administered 2021-11-17: 2 via RESPIRATORY_TRACT
  Filled 2021-11-17: qty 8.8

## 2021-11-17 MED ORDER — ALBUTEROL SULFATE (2.5 MG/3ML) 0.083% IN NEBU
2.5000 mg | INHALATION_SOLUTION | Freq: Four times a day (QID) | RESPIRATORY_TRACT | Status: DC
  Filled 2021-11-17: qty 3

## 2021-11-17 MED ORDER — IPRATROPIUM-ALBUTEROL 0.5-2.5 (3) MG/3ML IN SOLN
3.0000 mL | Freq: Once | RESPIRATORY_TRACT | Status: DC
Start: 2021-11-17 — End: 2021-11-17

## 2021-11-17 MED ORDER — HOME MED STORE IN PYXIS
1.0000 | Freq: Two times a day (BID) | Status: DC
Start: 2021-11-17 — End: 2021-11-17

## 2021-11-17 MED ORDER — PREDNISONE 50 MG PO TABS: 50.0000 mg | ORAL_TABLET | Freq: Every day | ORAL | Status: DC

## 2021-11-17 MED ORDER — FLUTICASONE PROPIONATE 93 MCG/ACT NA EXHU
1.0000 | INHALANT_SUSPENSION | Freq: Two times a day (BID) | NASAL | Status: DC
  Administered 2021-11-17: 1 via NASAL

## 2021-11-17 NOTE — Progress Notes (Signed)
   11/17/21 0959  Assess: MEWS Score  Temp 98.1 F (36.7 C)  BP 115/69  Pulse Rate (!) 116  Resp 16  SpO2 95 %  O2 Device Room Air  Assess: MEWS Score  MEWS Temp 0  MEWS Systolic 0  MEWS Pulse 2  MEWS RR 0  MEWS LOC 0  MEWS Score 2  MEWS Score Color Yellow  Assess: if the MEWS score is Yellow or Red  Were vital signs taken at a resting state? Yes  Focused Assessment No change from prior assessment  Does the patient meet 2 or more of the SIRS criteria? No  MEWS guidelines implemented *See Row Information* Yes  Treat  MEWS Interventions Other (Comment) (Patient is stable, no change, no treatment needed)  Take Vital Signs  Increase Vital Sign Frequency  Yellow: Q 2hr X 2 then Q 4hr X 2, if remains yellow, continue Q 4hrs  Escalate  MEWS: Escalate Yellow: discuss with charge nurse/RN and consider discussing with provider and RRT  Notify: Charge Nurse/RN  Name of Charge Nurse/RN Notified Pamala Hurry RN  Date Charge Nurse/RN Notified 11/17/21  Time Charge Nurse/RN Notified [phone]  Document  Progress note created (see row info) Yes  Assess: SIRS CRITERIA  SIRS Temperature  0  SIRS Pulse 1  SIRS Respirations  0  SIRS WBC 0  SIRS Score Sum  1   Patient went into the yellow MEWS at 0500 on 11/17/2021. Rechecked vitals and patient still in yellow MEWS due to heart rate. Discussed with charge nurse to make aware. Yellow MEWS guidelines implemented. Will continue to monitor until patient is discharged today.

## 2021-11-17 NOTE — TOC CM/SW Note (Signed)
  Transition of Care [facility]) Screening Note   Patient Details  Name: Phyllis Collins Date of Birth: Jan 07, 1966   [facility] CM/SW Contact:    Ross Ludwig, LCSW Phone Number: 11/17/2021, 11:31 AM    Transition of Care [facility]) has reviewed patient and no TOC needs have been identified at this time. We will continue to monitor patient advancement through interdisciplinary progression rounds. If new patient transition needs arise, please place a TOC consult.

## 2021-11-17 NOTE — Discharge Summary (Signed)
Physician Discharge Summary  Phyllis Collins NGE:[id number] DOB: 1966-03-29 DOA: 11/16/2021  PCP: Robyne Peers, MD  Admit date: 11/16/2021 Discharge date: 11/17/2021  Admitted From: Home Disposition:  Home  Recommendations for Outpatient Follow-up:  Follow up with PCP Follow up with Pulmonology as scheduled this afternoon  Recommend referral to Neurology for EMG testing if numbness/tingling of LUE persists  Discharge Condition: Stable CODE STATUS: Full  Diet recommendation:  Diet Orders (From admission, onward)     Start     Ordered   11/16/21 1034  Diet regular Room service appropriate? Yes; Fluid consistency: Thin  Diet effective now       Question Answer Comment  Room service appropriate? Yes   Fluid consistency: Thin      11/16/21 1034           Brief/Interim Summary: From H&P by Dr. Marylyn Ishihara: "Phyllis Collins is a 56 y.o. female with medical history significant of asthma. Presenting with shortness of breath. Symptoms started today ago. It was accompanied by cough. She Didn't have any fever. She didn't have any sick contacts. Her inhalers where initially helping, but then became ineffective by yesterday. She tried nebs, but they didn't help. When her symptoms did not improve last night, she decided to come to the ED for assistance. In addition to the above symptoms, she reports an intermittent numbness and tingling in her left arm for the last week. She has not had any focal weakness. No other neurological issues reported. She denies any other aggravating or alleviating factors."  During hospitalization, she required IV magnesium, IV solumedrol, and multiple neb treatments. CXR and CT Head was unremarkable. On morning of discharge, she was off [place] O2, breathing better. We discussed another day of hospitalization for monitoring and neb treatments, but she wanted to follow up with her new pulmonologist appointment in the afternoon on 5/30. She had no conversational  dyspnea or respiratory distress on my examination.   Discharge Diagnoses:  Principal Problem:   Asthma exacerbation Active Problems:   Hypokalemia   Paresthesia of left arm    Discharge Instructions  Discharge Instructions     Call MD for:  difficulty breathing, headache or visual disturbances   Complete by: As directed    Call MD for:  extreme fatigue   Complete by: As directed    Call MD for:  persistant dizziness or light-headedness   Complete by: As directed    Call MD for:  persistant nausea and vomiting   Complete by: As directed    Call MD for:  severe uncontrolled pain   Complete by: As directed    Call MD for:  temperature >100.4   Complete by: As directed    Discharge instructions   Complete by: As directed    You were cared for by a hospitalist during your hospital stay. If you have any questions about your discharge medications or the care you received while you were in the hospital after you are discharged, you can call the unit and ask to speak with the hospitalist on call if the hospitalist that took care of you is not available. Once you are discharged, your primary care physician will handle any further medical issues. Please note that NO REFILLS for any discharge medications will be authorized once you are discharged, as it is imperative that you return to your primary care physician (or establish a relationship with a primary care physician if you do not have one) for your aftercare needs so  that they can reassess your need for medications and monitor your lab values.   Increase activity slowly   Complete by: As directed       Allergies as of 11/17/2021       Reactions   Hydrocodone Hives   Ibuprofen Anaphylaxis   Nsaids Anaphylaxis   Nitrofurantoin Monohyd Macro    Ciprofloxacin Rash        Medication List     TAKE these medications    albuterol 108 (90 Base) MCG/ACT inhaler Commonly known as: VENTOLIN HFA Inhale 2 puffs into the lungs every 4  (four) hours as needed for shortness of breath or wheezing.   albuterol (2.5 MG/3ML) 0.083% nebulizer solution Commonly known as: PROVENTIL Take 3 mLs by nebulization every 6 (six) hours as needed for shortness of breath or wheezing.   budesonide 0.5 MG/2ML nebulizer solution Commonly known as: PULMICORT Inhale 2 mLs into the lungs daily.   fexofenadine 180 MG tablet Commonly known as: ALLEGRA Take 180 mg by mouth daily.   montelukast 10 MG tablet Commonly known as: SINGULAIR Take 10 mg by mouth at bedtime.   predniSONE 50 MG tablet Commonly known as: DELTASONE Take 1 tablet (50 mg total) by mouth daily for 4 days.   Trelegy Ellipta 200-62.5-25 MCG/ACT Aepb Generic drug: Fluticasone-Umeclidin-Vilant Inhale 1 puff into the lungs daily.   Xhance 93 MCG/ACT Exhu Generic drug: Fluticasone Propionate Place 1 spray into both nostrils 2 (two) times daily.        Follow-up Information     Robyne Peers, MD Follow up.   Specialty: Family Medicine Contact information: [place] [address] [place] [address] [phone]         Gearlean Alf, PA-C. Go on 11/17/2021.   Specialty: Physician Assistant Contact information: [address] [place] [address] [phone]                Allergies  Allergen Reactions   Hydrocodone Hives   Ibuprofen Anaphylaxis   Nsaids Anaphylaxis   Nitrofurantoin Monohyd Macro    Ciprofloxacin Rash    Consultations: None    Procedures/Studies: CT HEAD WO CONTRAST  Result Date: 11/16/2021 CLINICAL DATA:  Left arm weakness/numbness for 1-2 weeks. EXAM: CT HEAD WITHOUT CONTRAST TECHNIQUE: Contiguous axial images were obtained from the base of the skull through the vertex without intravenous contrast. RADIATION DOSE REDUCTION: This exam was performed according to the departmental dose-optimization program which includes automated exposure control, adjustment of the mA and/or kV according to  patient size and/or use of iterative reconstruction technique. COMPARISON:  None Available. FINDINGS: Brain: No evidence of acute infarction, hemorrhage, hydrocephalus, extra-axial collection or mass lesion/mass effect. Vascular: No hyperdense vessel or unexpected calcification. Skull: Normal. Negative for fracture or focal lesion. Sinuses/Orbits: Globes and orbits are unremarkable. Moderate mucosal thickening lines the frontal and ethmoid air cells. Mild maxillary and sphenoid sinus mucosal thickening. Other: None. IMPRESSION: 1. No intracranial abnormality. 2. Pansinus mucosal thickening as detailed. Electronically Signed   By: Lajean Manes M.D.   On: 11/16/2021 16:06   DG Chest Port 1 View  Result Date: 11/17/2021 CLINICAL DATA:  56 year old female with history of respiratory distress. EXAM: PORTABLE CHEST 1 VIEW COMPARISON:  Chest x-ray 11/16/2021. FINDINGS: Lung volumes are normal. No consolidative airspace disease. No pleural effusions. No pneumothorax. No pulmonary nodule or mass noted. Pulmonary vasculature and the cardiomediastinal silhouette are within normal limits. IMPRESSION: No radiographic evidence of acute cardiopulmonary disease. Electronically Signed  By: Vinnie Langton M.D.   On: 11/17/2021 05:37   DG Chest Portable 1 View  Result Date: 11/16/2021 CLINICAL DATA:  56 year old female with history of shortness of breath for the past week. Wheezing. EXAM: PORTABLE CHEST 1 VIEW COMPARISON:  Chest x-ray 10/05/2021. FINDINGS: Lung volumes are normal. No consolidative airspace disease. No pleural effusions. No pneumothorax. No pulmonary nodule or mass noted. Pulmonary vasculature and the cardiomediastinal silhouette are within normal limits. IMPRESSION: No radiographic evidence of acute cardiopulmonary disease. Electronically Signed   By: Vinnie Langton M.D.   On: 11/16/2021 06:59      Discharge Exam: Vitals:   11/16/21 2307 11/17/21 0507  BP: 105/68 135/79  Pulse: (!) 103 (!) 120   Resp: 18 (!) 23  Temp: 97.6 F (36.4 C) 98.5 F (36.9 C)  SpO2: 95% 96%    General: Pt is alert, awake, not in acute distress Cardiovascular: Tachycardic, regular rhythm, S1/S2 +, no edema Respiratory: +Mild expiratory wheezes, on room air, no respiratory distress, no conversational dyspnea  Abdominal: Soft, NT, ND, bowel sounds + Extremities: no edema, no cyanosis Psych: Normal mood and affect, stable judgement and insight     The results of significant diagnostics from this hospitalization (including imaging, microbiology, ancillary and laboratory) are listed below for reference.     Microbiology: Recent Results (from the past 240 hour(s))  SARS Coronavirus 2 by RT PCR (hospital order, performed in [facility] hospital lab) *cepheid single result test* Anterior Nasal Swab     Status: None   Collection Time: 11/16/21  6:25 AM   Specimen: Anterior Nasal Swab  Result Value Ref Range Status   SARS Coronavirus 2 by RT PCR NEGATIVE NEGATIVE Final    Comment: Performed at [facility], [address] [place], [place] [address]  MRSA Next Gen by PCR, Nasal     Status: None   Collection Time: 11/16/21 11:03 AM   Specimen: Nasal Mucosa; Nasal Swab  Result Value Ref Range Status   MRSA by PCR Next Gen NOT DETECTED NOT DETECTED Final    Comment: (NOTE) The GeneXpert MRSA Assay (FDA approved for NASAL specimens only), is one component of a comprehensive MRSA colonization surveillance program. It is not intended to diagnose MRSA infection nor to guide or monitor treatment for MRSA infections. Test performance is not FDA approved in patients less than 41 years old. Performed at [facility], [place] [address], [place], [place] [address]      Labs: BNP (last 3 results) Recent Labs    10/12/21 1425  BNP 9.4   Basic Metabolic Panel: Recent Labs  Lab 11/16/21 0626 11/16/21 0832 11/16/21 1059 11/17/21 0519  NA 137 140  --  141  K 3.1* 2.9*  --   4.4  CL 104  --   --  110  CO2 26  --   --  25  GLUCOSE 108*  --   --  112*  BUN 12  --   --  14  CREATININE 0.85  --   --  0.83  CALCIUM 8.7*  --   --  9.5  MG  --   --  2.1  --    Liver Function Tests: Recent Labs  Lab 11/17/21 0519  AST 18  ALT 17  ALKPHOS 76  BILITOT 0.4  PROT 7.2  ALBUMIN 3.5   No results for input(s): LIPASE, AMYLASE in the last 168 hours. No results for input(s): AMMONIA in the last 168  hours. CBC: Recent Labs  Lab 11/16/21 0626 11/16/21 0832 11/17/21 0519  WBC 6.1  --  18.4*  NEUTROABS 2.2  --   --   HGB 14.1 13.6 13.7  HCT 42.0 40.0 41.8  MCV 89.9  --  92.3  PLT 222  --  257   Cardiac Enzymes: No results for input(s): CKTOTAL, CKMB, CKMBINDEX, TROPONINI in the last 168 hours. BNP: Invalid input(s): POCBNP CBG: No results for input(s): GLUCAP in the last 168 hours. D-Dimer No results for input(s): DDIMER in the last 72 hours. Hgb A1c No results for input(s): HGBA1C in the last 72 hours. Lipid Profile No results for input(s): CHOL, HDL, LDLCALC, TRIG, CHOLHDL, LDLDIRECT in the last 72 hours. Thyroid function studies No results for input(s): TSH, T4TOTAL, T3FREE, THYROIDAB in the last 72 hours.  Invalid input(s): FREET3 Anemia work up No results for input(s): VITAMINB12, FOLATE, FERRITIN, TIBC, IRON, RETICCTPCT in the last 72 hours. Urinalysis    Component Value Date/Time   COLORURINE YELLOW 10/12/2021 1547   APPEARANCEUR HAZY (A) 10/12/2021 1547   LABSPEC 1.019 10/12/2021 1547   PHURINE 6.0 10/12/2021 1547   GLUCOSEU NEGATIVE 10/12/2021 1547   HGBUR NEGATIVE 10/12/2021 1547   BILIRUBINUR NEGATIVE 10/12/2021 1547   KETONESUR NEGATIVE 10/12/2021 1547   PROTEINUR TRACE (A) 10/12/2021 1547   UROBILINOGEN 1.0 03/18/2012 1637   NITRITE NEGATIVE 10/12/2021 1547   LEUKOCYTESUR MODERATE (A) 10/12/2021 1547   Sepsis Labs Invalid input(s): PROCALCITONIN,  WBC,  LACTICIDVEN Microbiology Recent Results (from the past 240 hour(s))   SARS Coronavirus 2 by RT PCR (hospital order, performed in [place] hospital lab) *cepheid single result test* Anterior Nasal Swab     Status: None   Collection Time: 11/16/21  6:25 AM   Specimen: Anterior Nasal Swab  Result Value Ref Range Status   SARS Coronavirus 2 by RT PCR NEGATIVE NEGATIVE Final    Comment: Performed at [facility], [address] [place], [place] [address]  MRSA Next Gen by PCR, Nasal     Status: None   Collection Time: 11/16/21 11:03 AM   Specimen: Nasal Mucosa; Nasal Swab  Result Value Ref Range Status   MRSA by PCR Next Gen NOT DETECTED NOT DETECTED Final    Comment: (NOTE) The GeneXpert MRSA Assay (FDA approved for NASAL specimens only), is one component of a comprehensive MRSA colonization surveillance program. It is not intended to diagnose MRSA infection nor to guide or monitor treatment for MRSA infections. Test performance is not FDA approved in patients less than 19 years old. Performed at [facility] [place] [address] [place], [place] [address]      Patient was seen and examined on the day of discharge and was found to be in stable condition. Time coordinating discharge: 40 minutes including assessment and coordination of care, as well as examination of the patient.   SIGNED:  Dessa Phi, DO [facility] 11/17/2021, 8:30 AM

## 2021-11-17 NOTE — Significant Event (Signed)
Rapid Response Event Note   Reason for Call :  Worsening shortness of breath, wheezing, already received nebulizer treatment.  Initial Focused Assessment:  Patient is alert, oriented, following commands, calm, but states she feels worse now than when she presented to the emergency department. Lung sounds with significant wheezing throughout all lung fields after hour long nebulizer treatment. Did not observe accessory muscle use for work of breathing. No peripheral edema observed. Vital signs within acceptable range except for tachycardia. Oxygen saturation 97% on room air.   Interventions:  Consulted with respiratory therapist and on call provider. Chest X-ray ordered and completed. Orders given for Solu-medrol and Magnesium infusion and these were administered.   Plan of Care:  Monitor effectiveness of interventions. May need ABG done if no improvement. Continue with telemetry monitoring in current bed assignment at this time, add oxygen via [place] for increased work of breathing as needed.    Event Summary:   MD Notified: Clarene Essex, NP Call Time: 0500 Arrival Time: 0503 End Time: [place], RN

## 2022-10-27 ENCOUNTER — Encounter (HOSPITAL_BASED_OUTPATIENT_CLINIC_OR_DEPARTMENT_OTHER): Payer: Self-pay

## 2022-10-27 ENCOUNTER — Emergency Department (HOSPITAL_BASED_OUTPATIENT_CLINIC_OR_DEPARTMENT_OTHER)
Admission: EM | Admit: 2022-10-27 | Discharge: 2022-10-27 | Disposition: A | Discharge status: Home / Self Care | Payer: PRIVATE HEALTH INSURANCE | Attending: Emergency Medicine | Admitting: Emergency Medicine

## 2022-10-27 DIAGNOSIS — R42 Dizziness and giddiness: Secondary | ICD-10-CM | POA: Insufficient documentation

## 2022-10-27 LAB — CBC WITH DIFFERENTIAL/PLATELET
Abs Immature Granulocytes: 0.01 10*3/uL (ref 0.00–0.07)
Basophils Absolute: 0 10*3/uL (ref 0.0–0.1)
Basophils Relative: 1 %
Eosinophils Absolute: 0.6 10*3/uL — ABNORMAL HIGH (ref 0.0–0.5)
Eosinophils Relative: 17 %
HCT: 39.8 % (ref 36.0–46.0)
Hemoglobin: 13.2 g/dL (ref 12.0–15.0)
Immature Granulocytes: 0 %
Lymphocytes Relative: 34 %
Lymphs Abs: 1.3 10*3/uL (ref 0.7–4.0)
MCH: 29.2 pg (ref 26.0–34.0)
MCHC: 33.2 g/dL (ref 30.0–36.0)
MCV: 88.1 fL (ref 80.0–100.0)
Monocytes Absolute: 0.4 10*3/uL (ref 0.1–1.0)
Monocytes Relative: 10 %
Neutro Abs: 1.5 10*3/uL — ABNORMAL LOW (ref 1.7–7.7)
Neutrophils Relative %: 38 %
Platelets: 205 10*3/uL (ref 150–400)
RBC: 4.52 MIL/uL (ref 3.87–5.11)
RDW: 11.9 % (ref 11.5–15.5)
WBC: 3.8 10*3/uL — ABNORMAL LOW (ref 4.0–10.5)
nRBC: 0 % (ref 0.0–0.2)

## 2022-10-27 LAB — COMPREHENSIVE METABOLIC PANEL
ALT: 18 U/L (ref 0–44)
AST: 20 U/L (ref 15–41)
Albumin: 3.4 g/dL — ABNORMAL LOW (ref 3.5–5.0)
Alkaline Phosphatase: 94 U/L (ref 38–126)
Anion gap: 7 (ref 5–15)
BUN: 17 mg/dL (ref 6–20)
CO2: 28 mmol/L (ref 22–32)
Calcium: 9.1 mg/dL (ref 8.9–10.3)
Chloride: 104 mmol/L (ref 98–111)
Creatinine, Ser: 0.91 mg/dL (ref 0.44–1.00)
GFR, Estimated: >60 mL/min (ref >60)
Glucose, Bld: 94 mg/dL (ref 70–99)
Potassium: 3.9 mmol/L (ref 3.5–5.1)
Sodium: 139 mmol/L (ref 135–145)
Total Bilirubin: 0.7 mg/dL (ref 0.3–1.2)
Total Protein: 7 g/dL (ref 6.5–8.1)

## 2022-10-27 LAB — URINALYSIS, ROUTINE W REFLEX MICROSCOPIC
Bilirubin Urine: NEGATIVE
Glucose, UA: NEGATIVE mg/dL
Ketones, ur: NEGATIVE mg/dL
Nitrite: NEGATIVE
Protein, ur: NEGATIVE mg/dL
Specific Gravity, Urine: 1.02 (ref 1.005–1.030)
pH: 7 (ref 5.0–8.0)

## 2022-10-27 LAB — URINALYSIS, MICROSCOPIC (REFLEX)

## 2022-10-27 MED ORDER — SODIUM CHLORIDE 0.9 % IV BOLUS
1000.0000 mL | Freq: Once | INTRAVENOUS | Status: AC
  Administered 2022-10-27: 1000 mL via INTRAVENOUS

## 2022-10-27 MED ORDER — MECLIZINE HCL 25 MG PO TABS: 25.0000 mg | ORAL_TABLET | Freq: Three times a day (TID) | ORAL | 0 refills | Status: AC | PRN

## 2022-10-27 NOTE — ED Notes (Signed)
ED Provider at bedside. 

## 2022-10-27 NOTE — ED Triage Notes (Signed)
Pt reports to the ED with feelings of weakness and dizziness. States that her head is spinning and she feels bad.

## 2022-10-27 NOTE — Discharge Instructions (Signed)
Please read and follow all provided instructions.  Your diagnoses today include:  1. Lightheadedness   2. Dizziness     Tests performed today include: EKG, no signs of heart rhythm problems Blood cell counts and electrolytes: Were normal with normal red blood cells and electrolytes, normal kidney function Urine testing was normal without signs of infection Vital signs. See below for your results today.   Medications prescribed:  Meclizine: Medication for dizziness to try to see if it helps with your symptoms  Take any prescribed medications only as directed.  Home care instructions:  Follow any educational materials contained in this packet.  BE VERY CAREFUL not to take multiple medicines containing Tylenol (also called acetaminophen). Doing so can lead to an overdose which can damage your liver and cause liver failure and possibly death.   Follow-up instructions: Please follow-up with your primary care provider in the next 3 days for further evaluation of your symptoms.   Return instructions:  Please return to the Emergency Department if you experience worsening symptoms.  Return if you have weakness in your arms or legs, slurred speech, trouble walking or talking, confusion, or trouble with your balance.  Please return if you have any other emergent concerns.  Additional Information:  Your vital signs today were: BP 101/68   Pulse 85   Temp 98.4 F (36.9 C)   Resp 12   Ht 5\' 2"  (1.575 m)   Wt 68 kg   LMP 10/02/2022 (Approximate)   SpO2 98%   BMI 27.44 kg/m  If your blood pressure (BP) was elevated above 135/85 this visit, please have this repeated by your doctor within one month. --------------

## 2022-10-27 NOTE — ED Provider Notes (Signed)
[facility] Provider Note   CSN: [id number] Arrival date & time: 10/27/22  0857     History  Chief Complaint  Patient presents with   Dizziness    Phyllis Collins is a 57 y.o. female.  Patient with history of asthma presents to the emergency department for evaluation of malaise over the past 2 weeks.  Patient reports an ongoing lightheaded sensation over this time.  She states that she feels generally bad.  She does not really describe a spinning or vertiginous sensation.  She has not fully passed out.  No associated headaches. Patient denies signs of stroke including: facial droop, slurred speech, aphasia, weakness/numbness in extremities, imbalance/trouble walking.  No fevers or other URI symptoms.  This morning she got up to go to work, but her symptoms seem to be worse, so she decided not to put off getting checked any longer.  She denies history of hypertension, high cholesterol or diabetes.  She does have a family history of cardiac disease.  She has not had shortness of breath or chest pain or other symptoms that impair her regular activities.  She is eating and drinking well.  No diarrhea or vomiting.  No decreased appetite.  She denies history of thyroid disease.  She did start a new injectable medication for asthma 2 months ago, denies other medication adjustments or new medications.       Home Medications Prior to Admission medications   Medication Sig Start Date End Date Taking? Authorizing Provider  albuterol (VENTOLIN HFA) 108 (90 Base) MCG/ACT inhaler Inhale 2 puffs into the lungs every 4 (four) hours as needed for shortness of breath or wheezing. 05/01/20   [provider]  budesonide (PULMICORT) 0.5 MG/2ML nebulizer solution Inhale 2 mLs into the lungs daily. 09/14/21 09/14/22  [provider]  fexofenadine (ALLEGRA) 180 MG tablet Take 180 mg by mouth daily.    [provider]  Fluticasone  Propionate (XHANCE) 93 MCG/ACT EXHU Place 1 spray into both nostrils 2 (two) times daily.    [provider]  montelukast (SINGULAIR) 10 MG tablet Take 10 mg by mouth at bedtime.    [provider]  Dwyane Luo 200-62.5-25 MCG/ACT AEPB Inhale 1 puff into the lungs daily. Patient not taking: Reported on 11/16/2021 09/15/21   [provider]      Allergies    Hydrocodone, Ibuprofen, Nsaids, Nitrofurantoin monohyd macro, and Ciprofloxacin    Review of Systems   Review of Systems  Physical Exam Updated Vital Signs BP 106/73 (BP Location: Right Arm)   Pulse 85   Temp 98.4 F (36.9 C)   Resp 16   Ht 5\' 2"  (1.575 m)   Wt 68 kg   LMP 10/02/2022 (Approximate)   SpO2 98%   BMI 27.44 kg/m   Physical Exam Vitals and nursing note reviewed.  Constitutional:      Appearance: She is well-developed. She is not diaphoretic.  HENT:     Head: Normocephalic and atraumatic.     Right Ear: Tympanic membrane, ear canal and external ear normal.     Left Ear: Tympanic membrane, ear canal and external ear normal.     Nose: Nose normal.     Mouth/Throat:     Mouth: Mucous membranes are not dry.     Pharynx: Uvula midline.  Eyes:     General: Lids are normal.     Extraocular Movements:     Right eye: No nystagmus.  Left eye: No nystagmus.     Conjunctiva/sclera: Conjunctivae normal.     Pupils: Pupils are equal, round, and reactive to light.  Neck:     Vascular: Normal carotid pulses. No JVD.     Trachea: Trachea normal. No tracheal deviation.  Cardiovascular:     Rate and Rhythm: Normal rate and regular rhythm.     Pulses: No decreased pulses.          Radial pulses are 2+ on the right side and 2+ on the left side.     Heart sounds: Normal heart sounds, S1 normal and S2 normal. No murmur heard. Pulmonary:     Effort: Pulmonary effort is normal. No respiratory distress.     Breath sounds: Normal breath sounds. No wheezing.  Chest:     Chest wall: No  tenderness.  Abdominal:     General: Bowel sounds are normal.     Palpations: Abdomen is soft.     Tenderness: There is no abdominal tenderness. There is no guarding or rebound.  Musculoskeletal:        General: Normal range of motion.     Cervical back: Normal range of motion and neck supple. No tenderness or bony tenderness. No muscular tenderness.  Skin:    General: Skin is warm and dry.     Coloration: Skin is not pale.  Neurological:     Mental Status: She is alert and oriented to person, place, and time.     GCS: GCS eye subscore is 4. GCS verbal subscore is 5. GCS motor subscore is 6.     Cranial Nerves: No cranial nerve deficit.     Sensory: No sensory deficit.     Motor: No weakness.     Coordination: Coordination normal.     Gait: Gait normal.     Comments: Upper extremity myotomes tested bilaterally:  C5 Shoulder abduction 5/5 C6 Elbow flexion/wrist extension 5/5 C7 Elbow extension 5/5 C8 Finger flexion 5/5 T1 Finger abduction 5/5  Lower extremity myotomes tested bilaterally: L2 Hip flexion 5/5 L3 Knee extension 5/5 L4 Ankle dorsiflexion 5/5 S1 Ankle plantar flexion 5/5      ED Results / Procedures / Treatments   Labs (all labs ordered are listed, but only abnormal results are displayed) Labs Reviewed  CBC WITH DIFFERENTIAL/PLATELET - Abnormal; Notable for the following components:      Result Value   WBC 3.8 (*)    Neutro Abs 1.5 (*)    Eosinophils Absolute 0.6 (*)    All other components within normal limits  COMPREHENSIVE METABOLIC PANEL - Abnormal; Notable for the following components:   Albumin 3.4 (*)    All other components within normal limits  URINALYSIS, ROUTINE W REFLEX MICROSCOPIC - Abnormal; Notable for the following components:   Hgb urine dipstick TRACE (*)    Leukocytes,Ua SMALL (*)    All other components within normal limits  URINALYSIS, MICROSCOPIC (REFLEX) - Abnormal; Notable for the following components:   Bacteria, UA FEW (*)     All other components within normal limits    EKG EKG Interpretation  Date/Time:  Wednesday Oct 27 2022 09:08:44 EDT Ventricular Rate:  81 PR Interval:  148 QRS Duration: 73 QT Interval:  353 QTC Calculation: 410 R Axis:   75 Text Interpretation: Sinus rhythm Probable anteroseptal infarct, old No significant change since last tracing Confirmed by Alvino Blood ([id number]) on 10/27/2022 9:20:13 AM  Radiology No results found.  Procedures Procedures    Medications Ordered in  ED Medications  sodium chloride 0.9 % bolus 1,000 mL (0 mLs Intravenous Stopped 10/27/22 1034)    ED Course/ Medical Decision Making/ A&P    Patient seen and examined. History obtained directly from patient.   Labs/EKG: Ordered CBC, CMP, UA.  EKG also ordered and reviewed.  Imaging: None ordered  Medications/Fluids: IV fluid bolus  Most recent vital signs reviewed and are as follows: BP 106/73 (BP Location: Right Arm)   Pulse 85   Temp 98.4 F (36.9 C)   Resp 16   Ht 5\' 2"  (1.575 m)   Wt 68 kg   LMP 10/02/2022 (Approximate)   SpO2 98%   BMI 27.44 kg/m   Initial impression: Lightheadedness without syncope, generalized malaise  12:07 PM Reassessment performed. Patient appears stable.  Her blood pressures have been on the soft side, upper 90s low 100s systolic.  She was given IV fluids.  She had dizziness with lying flat and standing, but no associated abrupt vital sign changes.  Labs personally reviewed and interpreted including: CBC with mildly low white blood cell count and neutrophil otherwise normal hemoglobin; CMP unremarkable; UA without sign of infection.  EKG reviewed and interpreted as above.  I have also evaluated the patient's telemetry while in the ED and did not show any sign of arrhythmia.  Reviewed pertinent lab work and imaging with patient at bedside. Questions answered.   Most current vital signs reviewed and are as follows: BP 104/78   Pulse 79   Temp 98.4 F (36.9 C)    Resp 15   Ht 5\' 2"  (1.575 m)   Wt 68 kg   LMP 10/02/2022 (Approximate)   SpO2 100%   BMI 27.44 kg/m   Plan: Discharge to home.   Prescriptions written for: Meclizine trial  Other home care instructions discussed: Rest, hydration, continued monitoring of blood pressure.  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  ED return instructions discussed: Patient counseled to return if they have weakness in their arms or legs, slurred speech, trouble walking or talking, confusion, trouble with their balance, or if they have any other concerns. Patient verbalizes understanding and agrees with plan.   Follow-up instructions discussed: Patient encouraged to follow-up with their PCP in 3 days.  She also has an ENT appointment later this month and will discuss her symptoms with them as well.                              Medical Decision Making Amount and/or Complexity of Data Reviewed Labs: ordered.   Patient with vague symptomatic dizziness, mostly described as a lightheadedness but no full syncope with some symptoms with change in position that may be vertiginous in part.  Recommended meclizine trial, to see if this helps.  Otherwise no focal neurologic signs to suggest stroke.  No palpitations, chest pain, shortness of breath to suggest ACS or PE.  EKG today without prolonged QT, signs of WPW or Brugada syndrome, heart block, hypertrophy.  The patient's vital signs, pertinent lab work and imaging were reviewed and interpreted as discussed in the ED course. Hospitalization was considered for further testing, treatments, or serial exams/observation. However as patient is well-appearing, has a stable exam, and reassuring studies today, I do not feel that they warrant admission at this time. This plan was discussed with the patient who verbalizes agreement and comfort with this plan and seems reliable  and able to return to the Emergency Department with  worsening or changing symptoms.           Final Clinical Impression(s) / ED Diagnoses Final diagnoses:  Lightheadedness  Dizziness    Rx / DC Orders ED Discharge Orders          Ordered    meclizine (ANTIVERT) 25 MG tablet  3 times daily PRN        10/27/22 1200              Renne Crigler, PA-C 10/27/22 1212    Lonell Grandchild, MD 10/28/22 [phone]

## 2023-06-29 ENCOUNTER — Emergency Department (HOSPITAL_BASED_OUTPATIENT_CLINIC_OR_DEPARTMENT_OTHER)
Admission: EM | Admit: 2023-06-29 | Discharge: 2023-06-29 | Discharge status: Against Medical Advice | Payer: PRIVATE HEALTH INSURANCE | Attending: Emergency Medicine | Admitting: Emergency Medicine

## 2023-06-29 ENCOUNTER — Other Ambulatory Visit: Payer: Self-pay

## 2023-06-29 ENCOUNTER — Encounter (HOSPITAL_BASED_OUTPATIENT_CLINIC_OR_DEPARTMENT_OTHER): Payer: Self-pay | Admitting: Emergency Medicine

## 2023-06-29 DIAGNOSIS — Z5321 Procedure and treatment not carried out due to patient leaving prior to being seen by health care provider: Secondary | ICD-10-CM | POA: Insufficient documentation

## 2023-06-29 DIAGNOSIS — R42 Dizziness and giddiness: Secondary | ICD-10-CM | POA: Diagnosis present

## 2023-06-29 LAB — URINALYSIS, ROUTINE W REFLEX MICROSCOPIC
Bilirubin Urine: NEGATIVE
Glucose, UA: NEGATIVE mg/dL
Hgb urine dipstick: NEGATIVE
Ketones, ur: NEGATIVE mg/dL
Nitrite: NEGATIVE
Protein, ur: NEGATIVE mg/dL
Specific Gravity, Urine: 1.02 (ref 1.005–1.030)
pH: 7 (ref 5.0–8.0)

## 2023-06-29 LAB — URINALYSIS, MICROSCOPIC (REFLEX)

## 2023-06-29 NOTE — ED Triage Notes (Signed)
 Lightheaded x 3 days , worse today . Sinusitis 06/23/23 that resolved , unsure if this is related . Alert and oriented x 4 . Ambulatory with steady gait .

## 2023-12-18 IMAGING — CR DG CHEST 2V
2 series · 2 of 2 positions shown · non-contrast
Comparison: 04/23/2021

CLINICAL DATA: Cough and wheezing.

EXAM:
CHEST - 2 VIEW

[w chest pa]
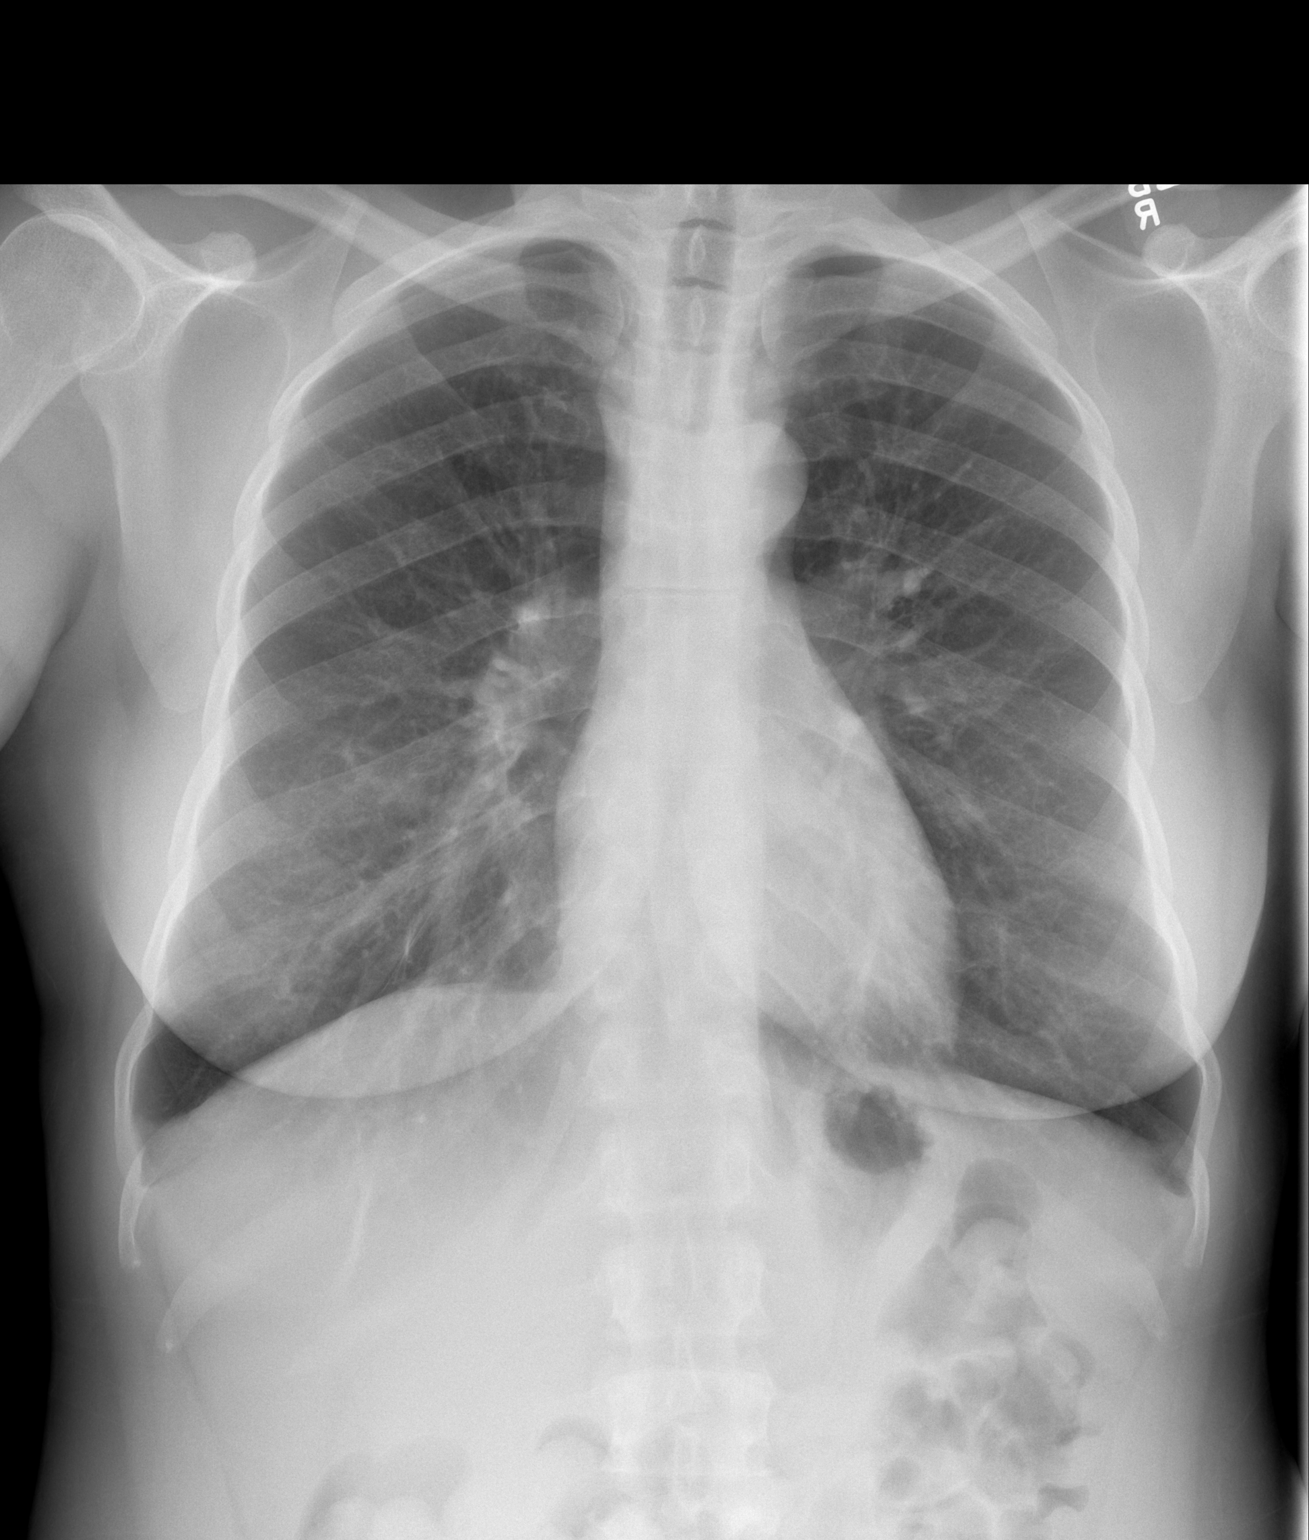

[w chest lat]
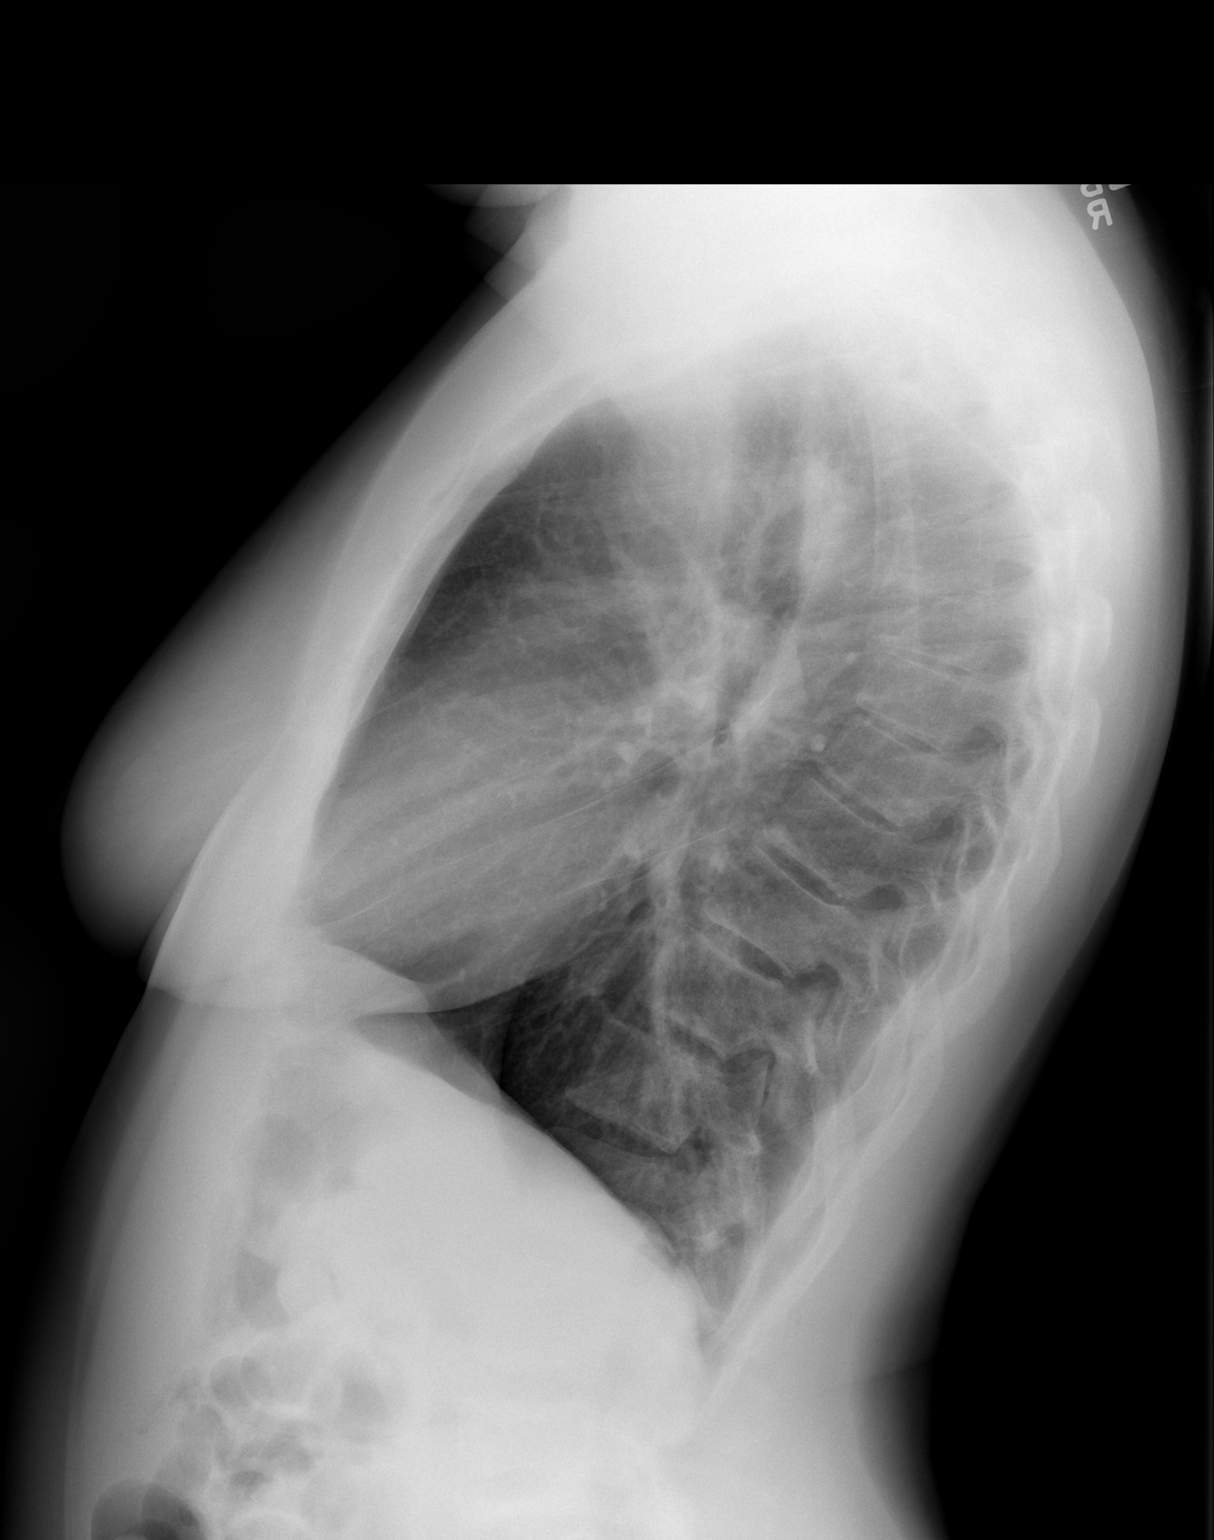

[2 of 2 positions shown; findings below may reference images not displayed]

FINDINGS: The heart size and mediastinal contours are within normal limits.
Both lungs are clear. The visualized skeletal structures are
unremarkable.
IMPRESSION: No active cardiopulmonary disease.
